# Patient Record
Sex: Female | Born: 2005 | Hispanic: No | Marital: Single | Smoking: Never smoker
Health system: Southern US, Community
[De-identification: ages and names within clinical notes are randomized; demographics above are authoritative.]

## PROBLEM LIST (undated history)

## (undated) ENCOUNTER — Ambulatory Visit: Payer: MEDICAID | Source: Home / Self Care

## (undated) DIAGNOSIS — F419 Anxiety disorder, unspecified: Secondary | ICD-10-CM

## (undated) DIAGNOSIS — J45909 Unspecified asthma, uncomplicated: Secondary | ICD-10-CM

## (undated) DIAGNOSIS — E785 Hyperlipidemia, unspecified: Secondary | ICD-10-CM

## (undated) DIAGNOSIS — F32A Depression, unspecified: Secondary | ICD-10-CM

## (undated) DIAGNOSIS — E039 Hypothyroidism, unspecified: Secondary | ICD-10-CM

## (undated) DIAGNOSIS — E079 Disorder of thyroid, unspecified: Secondary | ICD-10-CM

## (undated) DIAGNOSIS — E88819 Insulin resistance, unspecified: Secondary | ICD-10-CM

## (undated) DIAGNOSIS — E8881 Metabolic syndrome: Secondary | ICD-10-CM

## (undated) HISTORY — DX: Disorder of thyroid, unspecified: E07.9

## (undated) HISTORY — DX: Metabolic syndrome: E88.81

## (undated) HISTORY — DX: Insulin resistance, unspecified: E88.819

## (undated) HISTORY — DX: Anxiety disorder, unspecified: F41.9

## (undated) HISTORY — DX: Hypothyroidism, unspecified: E03.9

## (undated) HISTORY — DX: Hyperlipidemia, unspecified: E78.5

## (undated) HISTORY — DX: Depression, unspecified: F32.A

---
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
## 2023-01-07 ENCOUNTER — Ambulatory Visit
Admission: RE | Admit: 2023-01-07 | Discharge: 2023-01-07 | Disposition: A | Discharge status: Home / Self Care | Payer: Medicaid Other | Source: Ambulatory Visit | Attending: Internal Medicine | Admitting: Internal Medicine

## 2023-01-07 VITALS — BP 111/77 | HR 94 | Temp 97.9°F | Resp 18

## 2023-01-07 DIAGNOSIS — Z113 Encounter for screening for infections with a predominantly sexual mode of transmission: Secondary | ICD-10-CM | POA: Insufficient documentation

## 2023-01-07 DIAGNOSIS — R350 Frequency of micturition: Secondary | ICD-10-CM | POA: Insufficient documentation

## 2023-01-07 DIAGNOSIS — N926 Irregular menstruation, unspecified: Secondary | ICD-10-CM | POA: Insufficient documentation

## 2023-01-07 DIAGNOSIS — Z3202 Encounter for pregnancy test, result negative: Secondary | ICD-10-CM | POA: Insufficient documentation

## 2023-01-07 LAB — POCT URINALYSIS DIP (MANUAL ENTRY)
Bilirubin, UA: NEGATIVE
Blood, UA: NEGATIVE
Glucose, UA: NEGATIVE mg/dL
Ketones, POC UA: NEGATIVE mg/dL
Leukocytes, UA: NEGATIVE
Nitrite, UA: NEGATIVE
Protein Ur, POC: NEGATIVE mg/dL
Spec Grav, UA: 1.015 (ref 1.010–1.025)
Urobilinogen, UA: 0.2 E.U./dL
pH, UA: 6.5 (ref 5.0–8.0)

## 2023-01-07 LAB — POCT URINE PREGNANCY: Preg Test, Ur: NEGATIVE

## 2023-01-07 NOTE — Discharge Instructions (Signed)
Urine pregnancy test was negative.  Urinalysis to check for UTI was also negative.  Your vaginal swab is pending.  We will call if it is abnormal and treat as appropriate.  We ask that you refrain from sexual activity until test results and treatment are complete.  Follow-up with gynecology at provided contact information.  Go to the ER if any symptoms significantly persist or worsen.

## 2023-01-07 NOTE — ED Provider Notes (Addendum)
[name]    CSN: [id number] Arrival date & time: 01/07/23  1519      History   Chief Complaint Chief Complaint  Patient presents with   Possible Pregnancy    Entered by patient    HPI Reynelda Arredondo is a 17 y.o. female.   Patient presents with concern for pregnancy today.  Reports that she is not sure exactly when her last menstrual cycle was but thinks it was approximately 2 months ago.  Reports that she typically has regular menstrual cycles.  She does not currently use any form of birth control.  Reports that she had Nexplanon in the past but had it removed "a while back".  Reports that she has been having urinary frequency and right lower back pain for about a week.  Denies urinary burning, vaginal discharge, abnormal vaginal bleeding, pelvic pain, abdominal pain, nausea, vomiting, chills, fever.  Reports that she has had unprotected sexual intercourse but denies exposure to STD.  Denies any injury to the back. Reports that she drinks soda mainly.   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 No pertinent surgical history.  OB History   No obstetric history on file.      Home Medications    Prior to Admission medications   Medication Sig Start Date End Date Taking? Authorizing Provider  cephALEXin (KEFLEX) 500 MG capsule Take 1 capsule (500 mg total) by mouth 4 (four) times  daily. Patient not taking: Reported on 06/10/2022 05/25/22   Mesner, Barbara Cower, MD  cetirizine (ZYRTEC) 10 MG tablet Take 10 mg by mouth daily. Patient not taking: Reported on 06/10/2022 01/01/18   [provider]  FLUoxetine (PROZAC) 20 MG capsule TAKE 1 CAPSULE BY MOUTH EVERY DAY Patient not taking: Reported on 06/10/2022 02/08/21   Georges Mouse, NP  levothyroxine (SYNTHROID) 75 MCG tablet Take 1 tablet (75 mcg total) by mouth daily. 12/25/20 12/25/21  Gretchen Short, NP  montelukast (SINGULAIR) 10 MG tablet Take 10 mg by mouth daily. Patient not taking: Reported on 06/10/2022 05/24/20   [provider]  Multiple Vitamin (MULTIVITAMIN) tablet Take 1 tablet by mouth daily. Patient not taking: Reported on 06/10/2022    [provider]  omeprazole (PRILOSEC) 20 MG capsule Take 1 capsule (20 mg total) by mouth daily. Patient not taking: Reported on 06/10/2022 01/27/20   Little, Ambrose Finland, MD  ondansetron (ZOFRAN ODT) 4 MG disintegrating tablet Take 1 tablet (4 mg total) by mouth every 8 (eight) hours as needed for nausea or vomiting. Patient not taking: Reported on 04/17/2020 01/27/20   Little, Ambrose Finland, MD  oxyCODONE-acetaminophen (PERCOCET) 5-325 MG tablet Take 1 tablet by mouth every 8 (eight) hours as needed for severe pain. Patient not taking: Reported on 06/10/2022 05/25/22   Mesner, Barbara Cower, MD  predniSONE (DELTASONE) 20 MG tablet Take 2 tablets (40 mg total)  by mouth daily. 12/31/22   Valinda Hoar, NP  Prenatal Vit-Fe Fumarate-FA (PRENATAL VITAMINS) 28-0.8 MG TABS Take one tablet daily by mouth. 09/26/22   Georges Mouse, NP  PROAIR HFA 108 [phone] Base) MCG/ACT inhaler Inhale 2 puffs into the lungs every 4 (four) hours as needed for wheezing. Patient not taking: Reported on 06/10/2022 05/26/21   Lowanda Foster, NP    Family History Family History  Problem Relation Age of Onset   Hyperlipidemia Paternal Grandmother    Thyroid disease Paternal Grandmother      Social History Social History   Tobacco Use   Smoking status: Never    Passive exposure: Yes   Smokeless tobacco: Never  Vaping Use   Vaping Use: Some days   Substances: CBD  Substance Use Topics   Alcohol use: Yes    Comment: socially   Drug use: Yes    Types: Marijuana    Comment: daily     Allergies   Patient has no known allergies.   Review of Systems Review of Systems Per HPI  Physical Exam Triage Vital Signs ED Triage Vitals [01/07/23 1528]  Enc Vitals Group     BP 111/77     Pulse Rate 94     Resp 18     Temp 97.9 F (36.6 C)     Temp Source Oral     SpO2 95 %     Weight      Height      Head Circumference      Peak Flow      Pain Score      Pain Loc      Pain Edu?      Excl. in GC?    No data found.  Updated Vital Signs BP 111/77 (BP Location: Left Arm)   Pulse 94   Temp 97.9 F (36.6 C) (Oral)   Resp 18   LMP  (LMP Unknown)   SpO2 95%   Visual Acuity Right Eye Distance:   Left Eye Distance:   Bilateral Distance:    Right Eye Near:   Left Eye Near:    Bilateral Near:     Physical Exam Constitutional:      General: She is not in acute distress.    Appearance: Normal appearance. She is not toxic-appearing or diaphoretic.  HENT:     Head: Normocephalic and atraumatic.  Eyes:     Extraocular Movements: Extraocular movements intact.     Conjunctiva/sclera: Conjunctivae normal.  Pulmonary:     Effort: Pulmonary effort is normal.  Genitourinary:    Comments: Deferred with shared decision making. Self swab performed. Musculoskeletal:     Comments: Has very mild tenderness to palpation to right flank/right lower back.  No direct spinal tenderness, crepitus, step-off noted.  Neurological:     General: No focal deficit present.     Mental Status: She is alert and oriented to person, place, and time. Mental status is at baseline.  Psychiatric:        Mood and Affect: Mood normal.        Behavior: Behavior normal.        Thought  Content: Thought content normal.        Judgment: Judgment normal.      UC Treatments / Results  Labs (all labs ordered are listed, but only abnormal results are displayed) Labs Reviewed  POCT URINALYSIS DIP (MANUAL ENTRY) - Normal  POCT URINE PREGNANCY  CERVICOVAGINAL ANCILLARY ONLY  EKG   Radiology No results found.  Procedures Procedures (including critical care time)  Medications Ordered in UC Medications - No data to display  Initial Impression / Assessment and Plan / UC Course  I have reviewed the triage vital signs and the nursing notes.  Pertinent labs & imaging results that were available during my care of the patient were reviewed by me and considered in my medical decision making (see chart for details).     Urine pregnancy test was negative.  UA was completely unremarkable.  Offered patient hCG pregnancy testing by blood but she adamantly declined this.  Vaginal swab pending to rule out vaginitis as cause of missed menstrual cycle and urinary symptoms.  Awaiting results.  Low suspicion for pyelonephritis or kidney stone on physical exam.  Suspect possible musculoskeletal etiology of back pain.  Although, patient was encouraged to go to the emergency department if any of the symptoms persist or worsen.  Encouraged follow-up with gynecology at provided contact information given missed menstrual cycle as well.  Patient verbalized understanding and was agreeable with plan.  After further review of the chart, it appears the patient was seen approximately 7 days ago and had some right-sided flank pain as well.  Given this, attempted to contact patient after discharge to discuss that possible CT imaging at the emergency department may be warranted given persistent symptoms and new onset urinary symptoms.  Patient did not answer.  Left voicemail per privacy.  Despite the symptoms being persistent, patient is not having any fever and vital signs are stable so have a low  concern for pyelonephritis or kidney stone or an absolute need for emergent evaluation but it would be reasonable.. Do not have imaging capabilities here in urgent care.  Final Clinical Impressions(s) / UC Diagnoses   Final diagnoses:  Urine pregnancy test negative  Missed menses  Urinary frequency  Screening examination for venereal disease     Discharge Instructions      Urine pregnancy test was negative.  Urinalysis to check for UTI was also negative.  Your vaginal swab is pending.  We will call if it is abnormal and treat as appropriate.  We ask that you refrain from sexual activity until test results and treatment are complete.  Follow-up with gynecology at provided contact information.  Go to the ER if any symptoms significantly persist or worsen.    ED Prescriptions   None    PDMP not reviewed this encounter.   Gustavus Bryant, [place] 01/07/23 1615    Gustavus Bryant, [place] 01/07/23 1616

## 2023-01-07 NOTE — ED Triage Notes (Signed)
Pt present urinary frequency with back pain and she missed her period for two months. Symptoms started a week ago.

## 2023-01-08 ENCOUNTER — Telehealth: Payer: Self-pay | Admitting: Internal Medicine

## 2023-01-08 NOTE — Telephone Encounter (Signed)
Called patient to check on her and see if symptoms are still persistent.  She reports that she took Tylenol and she feels better.  Although, discussed with patient given flank pain has been persistent for over a week and given she has developed new urinary symptoms that she may need CT imaging and further evaluation at the emergency department.  She was agreeable with this plan and states that she will go today.  All questions answered.

## 2023-01-09 LAB — CERVICOVAGINAL ANCILLARY ONLY
Bacterial Vaginitis (gardnerella): NEGATIVE
Candida Glabrata: POSITIVE — AB
Candida Vaginitis: POSITIVE — AB
Chlamydia: NEGATIVE
Comment: NEGATIVE
Comment: NEGATIVE
Comment: NEGATIVE
Comment: NEGATIVE
Comment: NEGATIVE
Comment: NORMAL
Neisseria Gonorrhea: NEGATIVE
Trichomonas: NEGATIVE

## 2023-01-13 ENCOUNTER — Telehealth (HOSPITAL_COMMUNITY): Payer: Self-pay | Admitting: Emergency Medicine

## 2023-01-13 MED ORDER — FLUCONAZOLE 150 MG PO TABS: 150.0000 mg | ORAL_TABLET | Freq: Once | ORAL | 0 refills | Status: AC

## 2023-04-08 ENCOUNTER — Other Ambulatory Visit: Payer: Self-pay

## 2023-04-08 ENCOUNTER — Emergency Department (HOSPITAL_COMMUNITY): Payer: MEDICAID

## 2023-04-08 ENCOUNTER — Encounter (HOSPITAL_COMMUNITY): Payer: Self-pay

## 2023-04-08 ENCOUNTER — Emergency Department (HOSPITAL_COMMUNITY)
Admission: EM | Admit: 2023-04-08 | Discharge: 2023-04-08 | Disposition: A | Discharge status: Home / Self Care | Payer: MEDICAID | Attending: Emergency Medicine | Admitting: Emergency Medicine

## 2023-04-08 DIAGNOSIS — L0390 Cellulitis, unspecified: Secondary | ICD-10-CM | POA: Insufficient documentation

## 2023-04-08 DIAGNOSIS — R1030 Lower abdominal pain, unspecified: Secondary | ICD-10-CM | POA: Diagnosis not present

## 2023-04-08 DIAGNOSIS — Z7722 Contact with and (suspected) exposure to environmental tobacco smoke (acute) (chronic): Secondary | ICD-10-CM | POA: Diagnosis not present

## 2023-04-08 DIAGNOSIS — R0602 Shortness of breath: Secondary | ICD-10-CM | POA: Diagnosis not present

## 2023-04-08 DIAGNOSIS — J45909 Unspecified asthma, uncomplicated: Secondary | ICD-10-CM | POA: Diagnosis not present

## 2023-04-08 DIAGNOSIS — E039 Hypothyroidism, unspecified: Secondary | ICD-10-CM | POA: Insufficient documentation

## 2023-04-08 DIAGNOSIS — R059 Cough, unspecified: Secondary | ICD-10-CM | POA: Diagnosis not present

## 2023-04-08 LAB — URINALYSIS, ROUTINE W REFLEX MICROSCOPIC
Bilirubin Urine: NEGATIVE
Glucose, UA: NEGATIVE mg/dL
Hgb urine dipstick: NEGATIVE
Ketones, ur: NEGATIVE mg/dL
Leukocytes,Ua: NEGATIVE
Nitrite: NEGATIVE
Protein, ur: NEGATIVE mg/dL
Specific Gravity, Urine: 1.019 (ref 1.005–1.030)
pH: 6 (ref 5.0–8.0)

## 2023-04-08 LAB — PREGNANCY, URINE: Preg Test, Ur: NEGATIVE

## 2023-04-08 MED ORDER — DOXYCYCLINE HYCLATE 100 MG PO CAPS: 100.0000 mg | ORAL_CAPSULE | Freq: Two times a day (BID) | ORAL | 0 refills | Status: AC

## 2023-04-08 NOTE — ED Notes (Signed)
Transported to XR  

## 2023-04-08 NOTE — ED Notes (Signed)
Pt ambulatory to bathroom without difficulty.  

## 2023-04-08 NOTE — ED Provider Notes (Signed)
[facility] Emergency Department Provider Note MRN:  [id number]  Arrival date & time: 04/08/23     Chief Complaint   Shortness of Breath   History of Present Illness   Emily Allen is a 17 y.o. year-old female with a history of anxiety, depression presenting to the ED with chief complaint of shortness of breath.  2 months of persistent productive cough, feels short of breath because of all of the mucus.  Denies leg pain or swelling, no history of blood clots, no birth control pills.  Also has a boil on her leg but she popped but now it is red.  Also having some lower abdominal cramping on occasion.  Review of Systems  A thorough review of systems was obtained and all systems are negative except as noted in the HPI and PMH.   Patient's Health History    Past Medical History:  Diagnosis Date   Anxiety    Phreesia 04/27/2020   Asthma    Depression    Phreesia 04/27/2020   Hyperlipidemia    Phreesia 04/27/2020   Hypothyroidism    Insulin resistance    Thyroid disease    Phreesia 04/27/2020    History reviewed. No pertinent surgical history.  Family History  Problem Relation Age of Onset   Hyperlipidemia Paternal Grandmother    Thyroid disease Paternal Grandmother     Social History   Socioeconomic History   Marital status: Single    Spouse name: Not on file   Number of children: Not on file   Years of education: Not on file   Highest education level: Not on file  Occupational History   Not on file  Tobacco Use   Smoking status: Never    Passive exposure: Yes   Smokeless tobacco: Never  Vaping Use   Vaping status: Some Days   Substances: CBD  Substance and Sexual Activity   Alcohol use: Yes    Comment: socially   Drug use: Yes    Types: Marijuana    Comment: daily   Sexual activity: Not on file  Other Topics Concern   Not on file  Social History Narrative   [name] 9th grade. In class and virtual.      Lives with [name] and  brother.    Social Determinants of Health   Financial Resource Strain: Not on file  Food Insecurity: Not on file  Transportation Needs: Not on file  Physical Activity: Not on file  Stress: Not on file  Social Connections: Unknown (12/22/2022)   Received from [facility], [facility]   Social Network    Social Network: Not on file  Intimate Partner Violence: Unknown (12/22/2022)   Received from [facility], [facility]   HITS    Physically Hurt: Not on file    Insult or Talk Down To: Not on file    Threaten Physical Harm: Not on file    Scream or Curse: Not on file     Physical Exam   Vitals:   04/08/23 0225 04/08/23 0305  BP: 106/76 (!) 104/54  Pulse: 97 74  Resp: 20 15  Temp: 98.1 F (36.7 C)   SpO2: 99% 100%    CONSTITUTIONAL: Well-appearing, NAD NEURO/PSYCH:  Alert and oriented x 3, no focal deficits EYES:  eyes equal and reactive ENT/NECK:  no LAD, no JVD CARDIO: Regular rate, well-perfused, normal S1 and S2 PULM:  CTAB no wheezing or rhonchi GI/GU:  non-distended, non-tender MSK/SPINE:  No gross deformities, no edema  SKIN:  no rash, atraumatic   *Additional and/or pertinent findings included in MDM below  Diagnostic and Interventional Summary    EKG Interpretation Date/Time:  Wednesday April 08 2023 02:04:07 EDT Ventricular Rate:  92 PR Interval:  142 QRS Duration:  80 QT Interval:  342 QTC Calculation: 422 R Axis:   92  Text Interpretation: Normal sinus rhythm Rightward axis Borderline ECG When compared with ECG of 17-Nov-2022 08:04, PREVIOUS ECG IS PRESENT Confirmed by Kennis Carina [phone]) on 04/08/2023 3:25:13 AM       Labs Reviewed  URINALYSIS, ROUTINE W REFLEX MICROSCOPIC - Abnormal; Notable for the following components:      Result Value   APPearance HAZY (*)    All other components within normal limits  PREGNANCY, URINE    DG Abdomen Acute W/Chest  Final Result      Medications - No data to display   Procedures  /  Critical  Care Procedures  ED Course and Medical Decision Making  Initial Impression and Ddx Patient with multiple complaints this evening.  Looks like she has a mild cellulitis to the legs stemming from a folliculitis that she popped on her own.  I do not palpate any further fluctuance, no need for I&D here in the emergency department.  Will provide a course of doxycycline.  She is having a chronic cough productive of sputum for the past 1 or 2 months.  She is having some vague abdominal cramps but not currently having any abdominal pain.  She is overall sitting comfortably and well-appearing and with normal vital signs and her abdomen is soft and her lungs are clear and she has no increased work of breathing.  Doubt emergent process.  Does not really seem short of breath.  PERC negative.  Past medical/surgical history that increases complexity of ED encounter: None  Interpretation of Diagnostics I personally reviewed the EKG and my interpretation is as follows: Sinus rhythm  X-ray normal  Patient Reassessment and Ultimate Disposition/Management     Discharge  Patient management required discussion with the following services or consulting groups:  None  Complexity of Problems Addressed Acute complicated illness or Injury  Additional Data Reviewed and Analyzed Further history obtained from: Further history from spouse/family member  Additional Factors Impacting ED Encounter Risk Prescriptions  Elmer Sow. Pilar Plate, MD [facility] Emergency Medicine [facility] [email or web address]  Final Clinical Impressions(s) / ED Diagnoses     ICD-10-CM   1. Cough, unspecified type  R05.9     2. Cellulitis, unspecified cellulitis site  L03.90       ED Discharge Orders          Ordered    doxycycline (VIBRAMYCIN) 100 MG capsule  2 times daily        04/08/23 0510             Discharge Instructions Discussed with and Provided to Patient:     Discharge Instructions       You were evaluated in the Emergency Department and after careful evaluation, we did not find any emergent condition requiring admission or further testing in the hospital.  Your exam/testing today is overall reassuring.  X-ray and EKG were normal.  Take the doxycycline antibiotic as directed.  Please return to the Emergency Department if you experience any worsening of your condition.   Thank you for allowing [place] to be a part of your care.       Sabas Sous, MD 04/08/23 [phone]

## 2023-04-08 NOTE — ED Triage Notes (Signed)
Pt reports shortness of breath associated with productive of cough x 2 days. She is also here with c/o back pain, abdominal pain and abscess to her right inner thigh.

## 2023-04-08 NOTE — Discharge Instructions (Signed)
You were evaluated in the Emergency Department and after careful evaluation, we did not find any emergent condition requiring admission or further testing in the hospital.  Your exam/testing today is overall reassuring.  X-ray and EKG were normal.  Take the doxycycline antibiotic as directed.  Please return to the Emergency Department if you experience any worsening of your condition.   Thank you for allowing [place] to be a part of your care.

## 2023-05-24 NOTE — Progress Notes (Deleted)
New Patient Note  RE: Emily Allen MRN: [id number] DOB: Jun 15, 2006 Date of Office Visit: 05/25/2023  Consult requested by: [name], MD Primary care provider: [name], MD  Chief Complaint: No chief complaint on file.  History of Present Illness: I had the pleasure of seeing Emily Allen for initial evaluation at the [facility] on 05/24/2023. She is a 17 y.o. female, who is referred here by [name], MD for the evaluation of asthma.  Discussed the use of AI scribe software for clinical note transcription with the patient, who gave verbal consent to proceed.  History of Present Illness             She reports symptoms of *** chest tightness, shortness of breath, coughing, wheezing, nocturnal awakenings for *** years. Current medications include *** which help. She reports *** using aerochamber with inhalers. She tried the following inhalers: ***. Main triggers are ***allergies, infections, weather changes, smoke, exercise, pet exposure. In the last month, frequency of symptoms: ***x/week. Frequency of nocturnal symptoms: ***x/month. Frequency of SABA use: ***x/week. Interference with physical activity: ***. Sleep is ***disturbed. In the last 12 months, emergency room visits/urgent care visits/doctor office visits or hospitalizations due to respiratory issues: ***. In the last 12 months, oral steroids courses: ***. Lifetime history of hospitalization for respiratory issues: ***. Prior intubations: ***. Asthma was diagnosed at age *** by ***. History of pneumonia: ***. She was evaluated by allergist ***pulmonologist in the past. Smoking exposure: ***. Up to date with flu vaccine: ***. Up to date with pneumonia vaccine: ***. Up to date with COVID-19 vaccine: ***. Prior Covid-19 infection: ***. History of reflux: ***.  Assessment and Plan: Emily Allen is a 17 y.o. female with: ***  Assessment and Plan               No follow-ups on  file.  No orders of the defined types were placed in this encounter.  Lab Orders  No laboratory test(s) ordered today    Other allergy screening: Asthma: {Blank single:19197::"yes","no"} Rhino conjunctivitis: {Blank single:19197::"yes","no"} Food allergy: {Blank single:19197::"yes","no"} Medication allergy: {Blank single:19197::"yes","no"} Hymenoptera allergy: {Blank single:19197::"yes","no"} Urticaria: {Blank single:19197::"yes","no"} Eczema:{Blank single:19197::"yes","no"} History of recurrent infections suggestive of immunodeficency: {Blank single:19197::"yes","no"}  Diagnostics: Spirometry:  Tracings reviewed. Her effort: {Blank single:19197::"Good reproducible efforts.","It was hard to get consistent efforts and there is a question as to whether this reflects a maximal maneuver.","Poor effort, data can not be interpreted."} FVC: ***L FEV1: ***L, ***% predicted FEV1/FVC ratio: ***% Interpretation: {Blank single:19197::"Spirometry consistent with mild obstructive disease","Spirometry consistent with moderate obstructive disease","Spirometry consistent with severe obstructive disease","Spirometry consistent with possible restrictive disease","Spirometry consistent with mixed obstructive and restrictive disease","Spirometry uninterpretable due to technique","Spirometry consistent with normal pattern","No overt abnormalities noted given today's efforts"}.  Please see scanned spirometry results for details.  Skin Testing: {Blank single:19197::"Select foods","Environmental allergy panel","Environmental allergy panel and select foods","Food allergy panel","None","Deferred due to recent antihistamines use"}. *** Results discussed with patient/family.   Past Medical History: Patient Active Problem List   Diagnosis Date Noted  . Nexplanon in place 11/04/2018  . Menorrhagia 08/10/2018  . Acanthosis nigricans 12/01/2017  . Insulin resistance 12/01/2017  . 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 Anxiety    Phreesia 04/27/2020  . Asthma   . Depression    Phreesia 04/27/2020  . Hyperlipidemia    Phreesia 04/27/2020  .  Hypothyroidism   . Insulin resistance   . Thyroid disease    Phreesia 04/27/2020   Past Surgical History: No past surgical history on file. Medication List:  Current Outpatient Medications  Medication Sig Dispense Refill  . cephALEXin (KEFLEX) 500 MG capsule Take 1 capsule (500 mg total) by mouth 4 (four) times daily. (Patient not taking: Reported on 06/10/2022) 28 capsule 0  . cetirizine (ZYRTEC) 10 MG tablet Take 10 mg by mouth daily. (Patient not taking: Reported on 06/10/2022)  3  . FLUoxetine (PROZAC) 20 MG capsule TAKE 1 CAPSULE BY MOUTH EVERY DAY (Patient not taking: Reported on 06/10/2022) 90 capsule 0  . levothyroxine (SYNTHROID) 75 MCG tablet Take 1 tablet (75 mcg total) by mouth daily. 30 tablet 3  . montelukast (SINGULAIR) 10 MG tablet Take 10 mg by mouth daily. (Patient not taking: Reported on 06/10/2022)    . Multiple Vitamin (MULTIVITAMIN) tablet Take 1 tablet by mouth daily. (Patient not taking: Reported on 06/10/2022)    . omeprazole (PRILOSEC) 20 MG capsule Take 1 capsule (20 mg total) by mouth daily. (Patient not taking: Reported on 06/10/2022) 14 capsule 0  . ondansetron (ZOFRAN ODT) 4 MG disintegrating tablet Take 1 tablet (4 mg total) by mouth every 8 (eight) hours as needed for nausea or vomiting. (Patient not taking: Reported on 04/17/2020) 5 tablet 0  . oxyCODONE-acetaminophen (PERCOCET) 5-325 MG tablet Take 1 tablet by mouth every 8 (eight) hours as needed for severe pain. (Patient not taking: Reported on 06/10/2022) 15 tablet 0  . predniSONE (DELTASONE) 20 MG tablet Take 2 tablets (40 mg total) by mouth daily. 10 tablet 0  . Prenatal Vit-Fe Fumarate-FA (PRENATAL VITAMINS)  28-0.8 MG TABS Take one tablet daily by mouth. 90 tablet 2  . PROAIR HFA 108 (90 Base) MCG/ACT inhaler Inhale 2 puffs into the lungs every 4 (four) hours as needed for wheezing. (Patient not taking: Reported on 06/10/2022) 18 g 1   No current facility-administered medications for this visit.   Allergies: No Known Allergies Social History: Social History   Socioeconomic History  . Marital status: Single    Spouse name: Not on file  . Number of children: Not on file  . Years of education: Not on file  . Highest education level: Not on file  Occupational History  . Not on file  Tobacco Use  . Smoking status: Never    Passive exposure: Yes  . Smokeless tobacco: Never  Vaping Use  . Vaping status: Some Days  . Substances: CBD  Substance and Sexual Activity  . Alcohol use: Yes    Comment: socially  . Drug use: Yes    Types: Marijuana    Comment: daily  . Sexual activity: Not on file  Other Topics Concern  . Not on file  Social History Narrative   [name] 9th grade. In class and virtual.      Lives with [name] and brother.    Social Determinants of Health   Financial Resource Strain: Not on file  Food Insecurity: Not on file  Transportation Needs: Not on file  Physical Activity: Not on file  Stress: Not on file  Social Connections: Unknown (12/22/2022)   Received [facility]   Social Network   . Social Network: Not on file   Lives in a ***. Smoking: *** Occupation: ***  Environmental History: Water Damage/mildew in the house: Copywriter, advertising in the family room: {Blank single:19197::"yes","no"} Carpet in the bedroom: {Blank single:19197::"yes","no"} Heating: {Blank single:19197::"electric","gas","heat  pump"} Cooling: {Blank single:19197::"central","window","heat pump"} Pet: {Blank single:19197::"yes ***","no"}  Family History: Family History  Problem Relation Age of Onset  . Hyperlipidemia Paternal Grandmother   .  Thyroid disease Paternal Grandmother    Problem                               Relation Asthma                                   *** Eczema                                *** Food allergy                          *** Allergic rhino conjunctivitis     ***  Review of Systems  Constitutional:  Negative for appetite change, chills, fever and unexpected weight change.  HENT:  Negative for congestion and rhinorrhea.   Eyes:  Negative for itching.  Respiratory:  Negative for cough, chest tightness, shortness of breath and wheezing.   Cardiovascular:  Negative for chest pain.  Gastrointestinal:  Negative for abdominal pain.  Genitourinary:  Negative for difficulty urinating.  Skin:  Negative for rash.  Neurological:  Negative for headaches.   Objective: There were no vitals taken for this visit. There is no height or weight on file to calculate BMI. Physical Exam Vitals and nursing note reviewed.  Constitutional:      Appearance: Normal appearance. She is well-developed.  HENT:     Head: Normocephalic and atraumatic.     Right Ear: Tympanic membrane and external ear normal.     Left Ear: Tympanic membrane and external ear normal.     Nose: Nose normal.     Mouth/Throat:     Mouth: Mucous membranes are moist.     Pharynx: Oropharynx is clear.  Eyes:     Conjunctiva/sclera: Conjunctivae normal.  Cardiovascular:     Rate and Rhythm: Normal rate and regular rhythm.     Heart sounds: Normal heart sounds. No murmur heard.    No friction rub. No gallop.  Pulmonary:     Effort: Pulmonary effort is normal.     Breath sounds: Normal breath sounds. No wheezing, rhonchi or rales.  Musculoskeletal:     Cervical back: Neck supple.  Skin:    General: Skin is warm.     Findings: No rash.  Neurological:     Mental Status: She is alert and oriented to person, place, and time.  Psychiatric:        Behavior: Behavior normal.  The plan was reviewed with the patient/family, and all  questions/concerned were addressed.  It was my pleasure to see Emily Allen today and participate in her care. Please feel free to contact me with any questions or concerns.  Sincerely,  Wyline Mood, DO Allergy & Immunology  [facility] office: [phone] [facility] office: [phone]

## 2023-05-25 ENCOUNTER — Ambulatory Visit: Payer: Self-pay | Admitting: Allergy

## 2023-09-15 ENCOUNTER — Emergency Department (HOSPITAL_COMMUNITY)
Admission: EM | Admit: 2023-09-15 | Discharge: 2023-09-15 | Discharge status: Against Medical Advice | Payer: MEDICAID | Attending: Emergency Medicine | Admitting: Emergency Medicine

## 2023-09-15 ENCOUNTER — Emergency Department (HOSPITAL_COMMUNITY): Payer: MEDICAID

## 2023-09-15 ENCOUNTER — Encounter (HOSPITAL_COMMUNITY): Payer: Self-pay

## 2023-09-15 ENCOUNTER — Other Ambulatory Visit: Payer: Self-pay

## 2023-09-15 DIAGNOSIS — R1013 Epigastric pain: Secondary | ICD-10-CM | POA: Insufficient documentation

## 2023-09-15 DIAGNOSIS — R0602 Shortness of breath: Secondary | ICD-10-CM | POA: Diagnosis not present

## 2023-09-15 DIAGNOSIS — R059 Cough, unspecified: Secondary | ICD-10-CM | POA: Insufficient documentation

## 2023-09-15 DIAGNOSIS — R0981 Nasal congestion: Secondary | ICD-10-CM | POA: Diagnosis not present

## 2023-09-15 DIAGNOSIS — Z5321 Procedure and treatment not carried out due to patient leaving prior to being seen by health care provider: Secondary | ICD-10-CM | POA: Diagnosis not present

## 2023-09-15 DIAGNOSIS — R197 Diarrhea, unspecified: Secondary | ICD-10-CM | POA: Diagnosis not present

## 2023-09-15 LAB — CBC WITH DIFFERENTIAL/PLATELET
Abs Immature Granulocytes: 0.02 10*3/uL (ref 0.00–0.07)
Basophils Absolute: 0 10*3/uL (ref 0.0–0.1)
Basophils Relative: 0 %
Eosinophils Absolute: 0.2 10*3/uL (ref 0.0–0.5)
Eosinophils Relative: 2 %
HCT: 41 % (ref 36.0–46.0)
Hemoglobin: 13.1 g/dL (ref 12.0–15.0)
Immature Granulocytes: 0 %
Lymphocytes Relative: 31 %
Lymphs Abs: 2.3 10*3/uL (ref 0.7–4.0)
MCH: 27.1 pg (ref 26.0–34.0)
MCHC: 32 g/dL (ref 30.0–36.0)
MCV: 84.7 fL (ref 80.0–100.0)
Monocytes Absolute: 0.3 10*3/uL (ref 0.1–1.0)
Monocytes Relative: 4 %
Neutro Abs: 4.6 10*3/uL (ref 1.7–7.7)
Neutrophils Relative %: 63 %
Platelets: 307 10*3/uL (ref 150–400)
RBC: 4.84 MIL/uL (ref 3.87–5.11)
RDW: 12.3 % (ref 11.5–15.5)
WBC: 7.4 10*3/uL (ref 4.0–10.5)
nRBC: 0 % (ref 0.0–0.2)

## 2023-09-15 LAB — URINALYSIS, ROUTINE W REFLEX MICROSCOPIC
Bacteria, UA: NONE SEEN
Bilirubin Urine: NEGATIVE
Glucose, UA: NEGATIVE mg/dL
Ketones, ur: NEGATIVE mg/dL
Leukocytes,Ua: NEGATIVE
Nitrite: NEGATIVE
Protein, ur: NEGATIVE mg/dL
RBC / HPF: >50 RBC/hpf (ref 0–5)
Specific Gravity, Urine: 1.029 (ref 1.005–1.030)
pH: 5 (ref 5.0–8.0)

## 2023-09-15 LAB — COMPREHENSIVE METABOLIC PANEL
ALT: 20 U/L (ref 0–44)
AST: 16 U/L (ref 15–41)
Albumin: 3.8 g/dL (ref 3.5–5.0)
Alkaline Phosphatase: 60 U/L (ref 38–126)
Anion gap: 7 (ref 5–15)
BUN: 9 mg/dL (ref 6–20)
CO2: 22 mmol/L (ref 22–32)
Calcium: 8.8 mg/dL — ABNORMAL LOW (ref 8.9–10.3)
Chloride: 109 mmol/L (ref 98–111)
Creatinine, Ser: 0.46 mg/dL (ref 0.44–1.00)
GFR, Estimated: >60 mL/min (ref >60)
Glucose, Bld: 119 mg/dL — ABNORMAL HIGH (ref 70–99)
Potassium: 3.9 mmol/L (ref 3.5–5.1)
Sodium: 138 mmol/L (ref 135–145)
Total Bilirubin: 0.3 mg/dL (ref 0.0–1.2)
Total Protein: 7.2 g/dL (ref 6.5–8.1)

## 2023-09-15 LAB — TROPONIN I (HIGH SENSITIVITY)
Troponin I (High Sensitivity): <2 ng/L (ref <18)
Troponin I (High Sensitivity): <2 ng/L (ref <18)

## 2023-09-15 LAB — HCG, SERUM, QUALITATIVE: Preg, Serum: NEGATIVE

## 2023-09-15 NOTE — ED Notes (Signed)
 Pt returned identification labels to front desk. States leaving prior to treatment.

## 2023-09-15 NOTE — ED Triage Notes (Signed)
 Pt reports with cough, diarrhea, and mid chest pain that goes down to her stomach since today.

## 2023-09-15 NOTE — ED Provider Triage Note (Signed)
 Emergency Medicine Provider Triage Evaluation Note  Emily Allen , a 18 y.o. female  was evaluated in triage.  Pt complains of productive cough for two months. No fevers. Reports nasal congestion. No rhinorrhea. Reports that occasionally will feel tight when she coughs. Reports that she has been having sharp pain intermittently since last night. Had some Sob last night that resolved. Diarrhea started this morning, reports some nausea, no vomiting. Denies any urinary or vaginal symptoms. Denies any sick contacts. Took Midol  this morning as she is on her cycle. She reports some epigastric pain that feels like her acid reflux. Reports she has been more flatulent and belching nmore.   Review of Systems  Positive:  Negative:   Physical Exam  BP 136/84   Pulse 69   Temp 98.3 F (36.8 C) (Oral)   Resp (!) 23   Ht 5' 3 (1.6 m)   Wt 106.6 kg   SpO2 99%   BMI 41.63 kg/m  Gen:   Awake, no distress   Resp:  Normal effort  MSK:   Moves extremities without difficulty  Other:  Abdomen soft and non tender. Diffuse anterior chest wall tenderness to palpation. CTAB. RRR.   Medical Decision Making  Medically screening exam initiated at 3:34 PM.  Appropriate orders placed.  Emily Allen was informed that the remainder of the evaluation will be completed by another provider, this initial triage assessment does not replace that evaluation, and the importance of remaining in the ED until their evaluation is complete.  Labs ordered.    Bernis Ernst, PA-C 09/15/23 1540

## 2023-09-16 ENCOUNTER — Ambulatory Visit
Admission: EM | Admit: 2023-09-16 | Discharge: 2023-09-16 | Disposition: A | Discharge status: Home / Self Care | Payer: MEDICAID

## 2023-09-16 ENCOUNTER — Ambulatory Visit (HOSPITAL_COMMUNITY): Payer: Self-pay

## 2023-09-16 VITALS — BP 130/82 | HR 96 | Temp 98.1°F | Resp 16

## 2023-09-16 DIAGNOSIS — J4541 Moderate persistent asthma with (acute) exacerbation: Secondary | ICD-10-CM

## 2023-09-16 DIAGNOSIS — R0789 Other chest pain: Secondary | ICD-10-CM

## 2023-09-16 DIAGNOSIS — R0602 Shortness of breath: Secondary | ICD-10-CM

## 2023-09-16 DIAGNOSIS — B349 Viral infection, unspecified: Secondary | ICD-10-CM

## 2023-09-16 LAB — POC COVID19/FLU A&B COMBO
Covid Antigen, POC: NEGATIVE
Influenza A Antigen, POC: NEGATIVE
Influenza B Antigen, POC: NEGATIVE

## 2023-09-16 MED ORDER — PREDNISONE 10 MG (21) PO TBPK: ORAL_TABLET | ORAL | 0 refills | Status: DC

## 2023-09-16 MED ORDER — AEROCHAMBER PLUS FLO-VU LARGE MISC
1.0000 | Freq: Once | Status: AC
  Administered 2023-09-16: 1

## 2023-09-16 MED ORDER — ALBUTEROL SULFATE HFA 108 (90 BASE) MCG/ACT IN AERS
2.0000 | INHALATION_SPRAY | Freq: Once | RESPIRATORY_TRACT | Status: AC
  Administered 2023-09-16: 2 via RESPIRATORY_TRACT

## 2023-09-16 NOTE — ED Triage Notes (Signed)
 Pt reports chest pain and tightness x2 days. Describes sharp pain that started in middle of chest and now runs down to middle of her stomach. Intermittent SOB reported. N/V have accompanied these symptoms x2 days. No med use at home for symptoms. Was seen at ED last night but walked out due to wait.

## 2023-09-16 NOTE — ED Provider Notes (Signed)
 [name]    CSN: [id number] Arrival date & time: 09/16/23  1731      History   Chief Complaint Chief Complaint  Patient presents with   Chest Injury    Tightening of the chest with sharp constant pain shooting up my chest and under my tittes. Diarrhea, vomiting, shortness of breath, and sometimes stomach pain - Entered by patient   Chest Pain    HPI Emily Allen is a 18 y.o. female.   Patient presents today with a 2 to 3-day history of URI symptoms including cough, congestion, chest pain, shortness of breath, chest tightness.  She is also developed nausea, vomiting, diarrhea with last episode earlier today.  She was seen in the emergency room yesterday and had negative CBC, CMP, troponins.  She does not have any viral testing.  She was not evaluated by provider outside of triage because she had to wait for 13+ hours and did not feel she could wait any longer.  She does have a history of asthma and states current symptoms are similar to previous episodes of this condition.  She has not been using her asthma medication more frequently.  Denies any recent antibiotics or steroids.  She is confident that she is not pregnant.  She has not had COVID in the past.  She has not had COVID-19 vaccinations.    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 No pertinent surgical history.  OB History   No obstetric history on  file.      Home Medications    Prior to Admission medications   Medication Sig Start Date End Date Taking? Authorizing Provider  albuterol  (PROVENTIL ) (2.5 MG/3ML) 0.083% nebulizer solution SMARTSIG:1 Nebule Via Nebulizer Every 4 Hours PRN 04/14/23  Yes [provider]  predniSONE  (STERAPRED UNI-PAK 21 TAB) 10 MG (21) TBPK tablet As directed 09/16/23  Yes Sophiah Rolin K, PA-C  SYMBICORT 80-4.5 MCG/ACT inhaler Inhale into the lungs. 06/19/23  Yes [provider]  FLUoxetine  (PROZAC ) 20 MG capsule TAKE 1 CAPSULE BY MOUTH EVERY DAY Patient not taking: Reported on 06/10/2022 02/08/21   Marijean Shouts, NP  levothyroxine  (SYNTHROID ) 75 MCG tablet Take 1 tablet (75 mcg total) by mouth daily. 12/25/20 12/25/21  Candee Cha, NP  montelukast  (SINGULAIR ) 10 MG tablet Take 10 mg by mouth daily. Patient not taking: Reported on 06/10/2022 05/24/20   [provider]  Multiple Vitamin (MULTIVITAMIN) tablet Take 1 tablet by mouth daily. Patient not taking: Reported on 06/10/2022    [provider]  Prenatal Vit-Fe Fumarate-FA (PRENATAL VITAMINS) 28-0.8 MG TABS Take one tablet daily by mouth. Patient not taking: Reported on 09/16/2023 09/26/22   Marijean Shouts, NP    Family History Family History  Problem Relation Age of Onset   Hyperlipidemia Paternal Grandmother    Thyroid  disease Paternal Grandmother     Social History Social History  Tobacco Use   Smoking status: Never    Passive exposure: Yes   Smokeless tobacco: Never  Vaping Use   Vaping status: Some Days   Substances: CBD  Substance Use Topics   Alcohol use: Yes    Comment: socially   Drug use: Yes    Types: Marijuana    Comment: daily     Allergies   Patient has no known allergies.   Review of Systems Review of Systems  Constitutional:  Positive for activity change. Negative for appetite change, fatigue and fever.  HENT:  Positive for congestion. Negative for sinus pressure,  sneezing and sore throat.   Respiratory:  Positive for cough, chest tightness and shortness of breath. Negative for wheezing.   Cardiovascular:  Negative for chest pain.  Gastrointestinal:  Positive for diarrhea, nausea and vomiting. Negative for abdominal pain.  Neurological:  Negative for dizziness, light-headedness and headaches.     Physical Exam Triage Vital Signs ED Triage Vitals  Encounter Vitals Group     BP 09/16/23 1738 130/82     Systolic BP Percentile --      Diastolic BP Percentile --      Pulse Rate 09/16/23 1738 96     Resp 09/16/23 1738 16     Temp 09/16/23 1738 98.1 F (36.7 C)     Temp Source 09/16/23 1738 Oral     SpO2 09/16/23 1738 96 %     Weight --      Height --      Head Circumference --      Peak Flow --      Pain Score 09/16/23 1740 7     Pain Loc --      Pain Education --      Exclude from Growth Chart --    No data found.  Updated Vital Signs BP 130/82 (BP Location: Left Arm)   Pulse 96   Temp 98.1 F (36.7 C) (Oral)   Resp 16   LMP 09/14/2023 (Exact Date)   SpO2 96%   Visual Acuity Right Eye Distance:   Left Eye Distance:   Bilateral Distance:    Right Eye Near:   Left Eye Near:    Bilateral Near:     Physical Exam Vitals reviewed.  Constitutional:      General: She is awake. She is not in acute distress.    Appearance: Normal appearance. She is well-developed. She is not ill-appearing.     Comments: Very pleasant female appears stated age in no acute distress sitting comfortably in exam room  HENT:     Head: Normocephalic and atraumatic.     Right Ear: Tympanic membrane, ear canal and external ear normal. Tympanic membrane is not erythematous or bulging.     Left Ear: Ear canal and external ear normal. A middle ear effusion is present. Tympanic membrane is not erythematous or bulging.     Nose:     Right Sinus: No maxillary sinus tenderness or frontal sinus tenderness.     Left Sinus: No maxillary sinus tenderness or frontal  sinus tenderness.     Mouth/Throat:     Pharynx: Uvula midline. Postnasal drip present. No oropharyngeal exudate or posterior oropharyngeal erythema.  Cardiovascular:     Rate and Rhythm: Normal rate and regular rhythm.     Heart sounds: Normal heart sounds, S1 normal and S2 normal. No murmur heard. Pulmonary:     Effort: Pulmonary effort is normal.     Breath sounds: Examination of  the right-lower field reveals decreased breath sounds. Examination of the left-lower field reveals decreased breath sounds. Decreased breath sounds present. No wheezing, rhonchi or rales.  Abdominal:     General: Bowel sounds are normal.     Palpations: Abdomen is soft.     Tenderness: There is no abdominal tenderness.     Comments: Benign abdominal exam  Psychiatric:        Behavior: Behavior is cooperative.      UC Treatments / Results  Labs (all labs ordered are listed, but only abnormal results are displayed) Labs Reviewed  POC COVID19/FLU A&B COMBO    EKG   Radiology DG Chest 2 View Result Date: 09/15/2023 CLINICAL DATA:  Cough.  Chest pain EXAM: CHEST - 2 VIEW COMPARISON:  X-ray 11/17/2022. FINDINGS: The heart size and mediastinal contours are within normal limits. No consolidation, pneumothorax or effusion. No edema. The visualized skeletal structures are unremarkable. Artifact from the patient's clothing. IMPRESSION: No acute cardiopulmonary disease. Electronically Signed   By: Adrianna Horde M.D.   On: 09/15/2023 16:48    Procedures Procedures (including critical care time)  Medications Ordered in UC Medications  albuterol  (VENTOLIN  HFA) 108 (90 Base) MCG/ACT inhaler 2 puff (2 puffs Inhalation Given 09/16/23 1836)  AeroChamber Plus Flo-Vu Large MISC 1 each (1 each Other Given 09/16/23 1836)    Initial Impression / Assessment and Plan / UC Course  I have reviewed the triage vital signs and the nursing notes.  Pertinent labs & imaging results that were available during my care of the  patient were reviewed by me and considered in my medical decision making (see chart for details).     Patient is well-appearing, afebrile, nontoxic, nontachycardic.  EKG was obtained by nursing staff in triage given associated chest tightness/chest pain that showed normal sinus rhythm with ventricular rate of 68 bpm without ischemic changes and without significant change compared to previous tracing.  Chest x-ray was deferred as she had a normal x-ray yesterday at the emergency room.  She did have some wheezing and we discussed that asthma is likely contributing to her symptoms.  She was given albuterol  in clinic with some improvement.  I recommend she continue using this every 4-6 hours as needed.  Viral testing was obtained and was negative for COVID and flu.  We discussed that viral illnesses likely triggered her asthma.  She was started on prednisone  taper with instructions to take NSAIDs with this medication.  She can use over-the-counter medications as Mucinex, Flonase , Tylenol  for additional symptom relief.  Discussed that if her symptoms are improving within a few days she is to return for reevaluation.  Strict return precautions given.  Work excuse note provided.  Final Clinical Impressions(s) / UC Diagnoses   Final diagnoses:  Viral illness  Moderate persistent asthma with acute exacerbation  Chest tightness  Shortness of breath     Discharge Instructions      You were negative for flu and COVID here.  I believe that you have a viral illness that is triggering your asthma.  Use the albuterol  every 4-6 hours as needed.  Start prednisone  taper.  Do not take NSAIDs with this medication including aspirin, ibuprofen /Advil , naproxen/Aleve.  You can use Mucinex, Tylenol , nasal saline/sinus rinses for additional symptom relief.  If you are not feeling better within a few days or if anything worsens and you have worsening cough, shortness of breath, chest pain you should be seen  immediately.     ED Prescriptions  Medication Sig Dispense Auth. Provider   predniSONE  (STERAPRED UNI-PAK 21 TAB) 10 MG (21) TBPK tablet As directed 21 tablet Kendrick Remigio K, PA-C      PDMP not reviewed this encounter.   Budd Cargo, PA-C 09/16/23 1918

## 2023-09-16 NOTE — Discharge Instructions (Signed)
 You were negative for flu and COVID here.  I believe that you have a viral illness that is triggering your asthma.  Use the albuterol  every 4-6 hours as needed.  Start prednisone  taper.  Do not take NSAIDs with this medication including aspirin, ibuprofen /Advil , naproxen/Aleve.  You can use Mucinex, Tylenol , nasal saline/sinus rinses for additional symptom relief.  If you are not feeling better within a few days or if anything worsens and you have worsening cough, shortness of breath, chest pain you should be seen immediately.

## 2023-09-23 ENCOUNTER — Ambulatory Visit
Admission: RE | Admit: 2023-09-23 | Discharge: 2023-09-23 | Disposition: A | Discharge status: Home / Self Care | Payer: MEDICAID | Source: Ambulatory Visit

## 2023-09-23 ENCOUNTER — Ambulatory Visit
Admission: RE | Admit: 2023-09-23 | Discharge: 2023-09-23 | Disposition: A | Discharge status: Home / Self Care | Payer: MEDICAID | Source: Ambulatory Visit | Attending: Internal Medicine | Admitting: Internal Medicine

## 2023-09-23 DIAGNOSIS — N3000 Acute cystitis without hematuria: Secondary | ICD-10-CM

## 2023-09-23 DIAGNOSIS — Z113 Encounter for screening for infections with a predominantly sexual mode of transmission: Secondary | ICD-10-CM

## 2023-09-23 LAB — POCT URINE PREGNANCY: Preg Test, Ur: NEGATIVE

## 2023-09-23 LAB — POCT URINALYSIS DIP (MANUAL ENTRY)
Bilirubin, UA: NEGATIVE
Blood, UA: NEGATIVE
Glucose, UA: NEGATIVE mg/dL
Ketones, POC UA: NEGATIVE mg/dL
Nitrite, UA: NEGATIVE
Protein Ur, POC: NEGATIVE mg/dL
Spec Grav, UA: 1.025 (ref 1.010–1.025)
Urobilinogen, UA: 0.2 U/dL
pH, UA: 6.5 (ref 5.0–8.0)

## 2023-09-23 MED ORDER — NITROFURANTOIN MONOHYD MACRO 100 MG PO CAPS: 100.0000 mg | ORAL_CAPSULE | Freq: Two times a day (BID) | ORAL | 0 refills | Status: DC

## 2023-09-23 NOTE — ED Triage Notes (Signed)
"  I have an uncomfortable feeling in my vaginal area and I believe it is either yeast infection or UTI". No fever.

## 2023-09-23 NOTE — Discharge Instructions (Addendum)
Labs results within 24-48 hours. Our office will only call if results are abnormal. You can view all result on My Chart.

## 2023-09-23 NOTE — ED Provider Notes (Addendum)
[name]    CSN: [id number] Arrival date & time: 09/23/23  1509      History   Chief Complaint Chief Complaint  Patient presents with   UTI Symptoms    HPI Emily Allen is a 18 y.o. female.   HPI Emily Allen is a 18 y.o. female presents for evaluation of urinary frequency, urgency and dysuria with vaginal discomfort. Patient denies abnormal vaginal discharge, flank pain, nausea, or vomiting. Symptoms present for a few days Patient's last menstrual period was 09/14/2023 (exact date).   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 No pertinent surgical history.  OB History   No obstetric history on file.      Home Medications    Prior to Admission medications   Medication Sig Start Date End Date Taking? Authorizing Provider  nitrofurantoin, macrocrystal-monohydrate, (MACROBID) 100 MG capsule Take 1 capsule (100 mg total) by mouth 2 (two) times daily. 09/23/23  Yes Bing Neighbors, NP  predniSONE (STERAPRED UNI-PAK 21 TAB) 10 MG (21) TBPK tablet As directed 09/16/23  Yes Raspet, Erin K, PA-C  albuterol (PROVENTIL) (2.5 MG/3ML) 0.083% nebulizer solution SMARTSIG:1 Nebule Via Nebulizer Every 4 Hours PRN 04/14/23   [provider]  FLUoxetine (PROZAC) 20 MG capsule TAKE 1 CAPSULE BY MOUTH EVERY DAY Patient not taking: Reported on 06/10/2022 02/08/21    Georges Mouse, NP  levothyroxine (SYNTHROID) 75 MCG tablet Take 1 tablet (75 mcg total) by mouth daily. 12/25/20 12/25/21  Gretchen Short, NP  montelukast (SINGULAIR) 10 MG tablet Take 10 mg by mouth daily. Patient not taking: Reported on 06/10/2022 05/24/20   [provider]  Multiple Vitamin (MULTIVITAMIN) tablet Take 1 tablet by mouth daily. Patient not taking: Reported on 06/10/2022    [provider]  Prenatal Vit-Fe Fumarate-FA (PRENATAL VITAMINS) 28-0.8 MG TABS Take one tablet daily by mouth. Patient not taking: Reported on 09/16/2023 09/26/22   Georges Mouse, NP  sulfamethoxazole-trimethoprim (BACTRIM DS) 800-160 MG tablet Take 1 tablet by mouth 2 (two) times daily for 7 days. 09/25/23 10/02/23  Zenia Resides, MD  SYMBICORT 80-4.5 MCG/ACT inhaler Inhale into the lungs. 06/19/23   [provider]    Family History Family History  Problem Relation Age of Onset   Hyperlipidemia Paternal Grandmother    Thyroid disease Paternal Grandmother     Social History Social History   Tobacco Use   Smoking status: Never    Passive exposure: Yes   Smokeless tobacco: Never  Vaping Use   Vaping status: Some Days   Substances: Nicotine, CBD, Flavoring  Substance Use Topics   Alcohol use: Yes    Comment: socially   Drug use: Not Currently    Types: Marijuana    Comment: daily     Allergies   Patient has no known allergies.   Review of  Systems Review of Systems Pertinent negatives listed in HPI   Physical Exam Triage Vital Signs ED Triage Vitals  Encounter Vitals Group     BP 09/23/23 1520 111/69     Systolic BP Percentile --      Diastolic BP Percentile --      Pulse Rate 09/23/23 1520 79     Resp 09/23/23 1520 20     Temp 09/23/23 1520 97.9 F (36.6 C)     Temp Source 09/23/23 1520 Oral     SpO2 09/23/23 1520 99 %     Weight 09/23/23 1518 230 lb (104.3 kg)     Height 09/23/23 1518 5\' 3"  (1.6 m)     Head Circumference --      Peak  Flow --      Pain Score 09/23/23 1516 10     Pain Loc --      Pain Education --      Exclude from Growth Chart --    No data found.  Updated Vital Signs BP 111/69 (BP Location: Right Arm)   Pulse 79   Temp 97.9 F (36.6 C) (Oral)   Resp 20   Ht 5\' 3"  (1.6 m)   Wt 230 lb (104.3 kg)   LMP 09/14/2023 (Exact Date)   SpO2 99%   BMI 40.74 kg/m   Visual Acuity Right Eye Distance:   Left Eye Distance:   Bilateral Distance:    Right Eye Near:   Left Eye Near:    Bilateral Near:     Physical Exam  General appearance: alert, well developed, well nourished, cooperative and in no distress Head: Normocephalic, without obvious abnormality, atraumatic Respiratory: Respirations even and unlabored, normal respiratory rate Heart: rate and rhythm normal.   CVA:  no flank pain Extremities: No gross deformities Skin: Skin color, texture, turgor normal. No rashes seen  Psych: Appropriate mood and affect.  UC Treatments / Results  Labs (all labs ordered are listed, but only abnormal results are displayed) Labs Reviewed  URINE CULTURE - Abnormal; Notable for the following components:      Result Value   Culture >=100,000 COLONIES/mL PROTEUS MIRABILIS (*)    Organism ID, Bacteria PROTEUS MIRABILIS (*)    All other components within normal limits  POCT URINALYSIS DIP (MANUAL ENTRY) - Abnormal; Notable for the following components:   Clarity, UA cloudy (*)    Leukocytes, UA Trace (*)    All other components within normal limits  CERVICOVAGINAL ANCILLARY ONLY - Abnormal; Notable for the following components:   Candida Vaginitis Positive (*)    Candida Glabrata Positive (*)    All other components within normal limits  POCT URINE PREGNANCY - Normal    EKG   Radiology No results found.  Procedures Procedures (including critical care time)  Medications Ordered in UC Medications - No data to display  Initial Impression / Assessment and Plan / UC Course  I have reviewed the  triage vital signs and the nursing notes.  Pertinent labs & imaging results that were available during my care of the patient were reviewed by me and considered in my medical decision making (see chart for details).      UA abnormal and findings consistent with UTI. Empiric antibiotic treatment initiated.  Encouraged increase intake of water. Urine culture pending. Vaginal Cytology pending to rule out vaginitis and STD. ER if symptoms become severe. Follow-up with PCP if symptoms do not completely resolve.  Final Clinical Impressions(s) / UC Diagnoses  Final diagnoses:  Acute cystitis without hematuria  Screen for STD (sexually transmitted disease)     Discharge Instructions      Labs results within 24-48 hours. Our office will only call if results are abnormal. You can view all result on My Chart.     ED Prescriptions     Medication Sig Dispense Auth. Provider   nitrofurantoin, macrocrystal-monohydrate, (MACROBID) 100 MG capsule Take 1 capsule (100 mg total) by mouth 2 (two) times daily. 10 capsule Bing Neighbors, NP      PDMP not reviewed this encounter.   Bing Neighbors, NP 09/26/23 1223    Bing Neighbors, NP 09/26/23 1224

## 2023-09-24 LAB — CERVICOVAGINAL ANCILLARY ONLY
Bacterial Vaginitis (gardnerella): NEGATIVE
Candida Glabrata: POSITIVE — AB
Candida Vaginitis: POSITIVE — AB
Chlamydia: NEGATIVE
Comment: NEGATIVE
Comment: NEGATIVE
Comment: NEGATIVE
Comment: NEGATIVE
Comment: NEGATIVE
Comment: NORMAL
Neisseria Gonorrhea: NEGATIVE
Trichomonas: NEGATIVE

## 2023-09-25 ENCOUNTER — Telehealth (HOSPITAL_BASED_OUTPATIENT_CLINIC_OR_DEPARTMENT_OTHER): Payer: Self-pay

## 2023-09-25 LAB — URINE CULTURE
Culture: >=100000 — AB
Special Requests: NORMAL

## 2023-09-25 MED ORDER — SULFAMETHOXAZOLE-TRIMETHOPRIM 800-160 MG PO TABS: 1.0000 | ORAL_TABLET | Freq: Two times a day (BID) | ORAL | 0 refills | Status: AC

## 2023-09-25 NOTE — Telephone Encounter (Signed)
Per protocol, pt to dc Macrobid and begin treatment with Bactrim. Attempted to reach patient x1. LMV.  Rx sent to pharmacy on file.

## 2023-10-11 ENCOUNTER — Ambulatory Visit: Payer: Self-pay

## 2023-10-12 ENCOUNTER — Ambulatory Visit
Admission: RE | Admit: 2023-10-12 | Discharge: 2023-10-12 | Disposition: A | Discharge status: Home / Self Care | Payer: MEDICAID | Source: Ambulatory Visit

## 2023-10-12 ENCOUNTER — Ambulatory Visit
Admission: RE | Admit: 2023-10-12 | Discharge: 2023-10-12 | Disposition: A | Discharge status: Home / Self Care | Payer: MEDICAID | Attending: Physician Assistant | Admitting: Physician Assistant

## 2023-10-12 DIAGNOSIS — J069 Acute upper respiratory infection, unspecified: Secondary | ICD-10-CM | POA: Diagnosis not present

## 2023-10-12 LAB — POCT RAPID STREP A (OFFICE): Rapid Strep A Screen: NEGATIVE

## 2023-10-12 NOTE — ED Triage Notes (Signed)
"  This started with Cough and then sore throat about 4 days ago". No fever. "Hoarse voice". No sob. No wheezing. No rash. "I have not had the Flu or COVID19 vaccines.

## 2023-10-12 NOTE — ED Provider Notes (Signed)
 [name]    CSN: [id number] Arrival date & time: 10/12/23  1455      History   Chief Complaint Chief Complaint  Patient presents with   Sore Throat   Asthma Exacerbation    HPI Emily Allen is a 18 y.o. female.   Patient here today for evaluation of cough and sore throat that started about 4 days ago.  She has not any fever.  She reports some hoarse voice but no shortness of breath or wheezing.  She denies any vomiting or diarrhea.  The history is provided by the patient.  Sore Throat Pertinent negatives include no abdominal pain and no shortness of breath.    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 No pertinent surgical history.  OB History   No obstetric history on file.      Home Medications    Prior to Admission medications   Medication Sig Start Date End Date Taking? Authorizing Provider  albuterol  (PROVENTIL ) (2.5 MG/3ML) 0.083% nebulizer solution SMARTSIG:1 Nebule Via Nebulizer Every 4 Hours PRN 04/14/23   [provider]  FLUoxetine  (PROZAC ) 20 MG capsule TAKE 1 CAPSULE BY MOUTH EVERY DAY Patient not taking: Reported on 06/10/2022 02/08/21   Marijean Shouts, NP  levothyroxine  (SYNTHROID ) 75 MCG tablet Take 1 tablet (75 mcg total) by mouth daily. 12/25/20 12/25/21  Candee Cha, NP  montelukast  (SINGULAIR ) 10 MG tablet Take 10 mg by  mouth daily. Patient not taking: Reported on 06/10/2022 05/24/20   [provider]  Multiple Vitamin (MULTIVITAMIN) tablet Take 1 tablet by mouth daily. Patient not taking: Reported on 06/10/2022    [provider]  nitrofurantoin , macrocrystal-monohydrate, (MACROBID ) 100 MG capsule Take 1 capsule (100 mg total) by mouth 2 (two) times daily. 09/23/23   Buena Carmine, NP  predniSONE  (STERAPRED UNI-PAK 21 TAB) 10 MG (21) TBPK tablet As directed 09/16/23   Raspet, Betsey Brow, PA-C  Prenatal Vit-Fe Fumarate-FA (PRENATAL VITAMINS) 28-0.8 MG TABS Take one tablet daily by mouth. Patient not taking: Reported on 09/16/2023 09/26/22   Marijean Shouts, NP  SYMBICORT 80-4.5 MCG/ACT inhaler Inhale into the lungs. 06/19/23   [provider]    Family History Family History  Problem Relation Age of Onset   Hyperlipidemia Paternal Grandmother    Thyroid  disease Paternal Grandmother     Social History Social History   Tobacco Use   Smoking status: Never    Passive exposure: Yes   Smokeless tobacco: Never  Vaping Use   Vaping status: Former   Substances: Nicotine, CBD, Flavoring  Substance Use Topics   Alcohol use: Yes    Comment: socially   Drug use: Yes    Types: Marijuana    Comment: daily     Allergies   Patient has no known allergies.   Review of Systems Review of  Systems  Constitutional:  Negative for chills and fever.  HENT:  Positive for congestion and sore throat. Negative for ear pain.   Eyes:  Negative for discharge and redness.  Respiratory:  Positive for cough. Negative for shortness of breath and wheezing.   Gastrointestinal:  Negative for abdominal pain, diarrhea, nausea and vomiting.     Physical Exam Triage Vital Signs ED Triage Vitals  Encounter Vitals Group     BP 10/12/23 1514 110/75     Systolic BP Percentile --      Diastolic BP Percentile --      Pulse Rate 10/12/23 1514 84     Resp 10/12/23 1514 20     Temp 10/12/23 1514 98.5  F (36.9 C)     Temp Source 10/12/23 1514 Oral     SpO2 10/12/23 1514 96 %     Weight 10/12/23 1512 230 lb (104.3 kg)     Height 10/12/23 1512 5\' 3"  (1.6 m)     Head Circumference --      Peak Flow --      Pain Score 10/12/23 1510 10     Pain Loc --      Pain Education --      Exclude from Growth Chart --    No data found.  Updated Vital Signs BP 110/75 (BP Location: Right Arm)   Pulse 84   Temp 98.5 F (36.9 C) (Oral)   Resp 20   Ht 5\' 3"  (1.6 m)   Wt 230 lb (104.3 kg)   LMP 10/11/2023 (Exact Date)   SpO2 96%   BMI 40.74 kg/m   Visual Acuity Right Eye Distance:   Left Eye Distance:   Bilateral Distance:    Right Eye Near:   Left Eye Near:    Bilateral Near:     Physical Exam Vitals and nursing note reviewed.  Constitutional:      General: She is not in acute distress.    Appearance: Normal appearance. She is not ill-appearing.  HENT:     Head: Normocephalic and atraumatic.     Right Ear: Tympanic membrane normal.     Left Ear: Tympanic membrane normal.     Nose: Congestion present.     Mouth/Throat:     Mouth: Mucous membranes are moist.     Pharynx: No oropharyngeal exudate or posterior oropharyngeal erythema.  Eyes:     Conjunctiva/sclera: Conjunctivae normal.  Cardiovascular:     Rate and Rhythm: Normal rate and regular rhythm.     Heart sounds: Normal heart sounds. No murmur heard. Pulmonary:     Effort: Pulmonary effort is normal. No respiratory distress.     Breath sounds: Normal breath sounds. No wheezing, rhonchi or rales.  Skin:    General: Skin is warm and dry.  Neurological:     Mental Status: She is alert.  Psychiatric:        Mood and Affect: Mood normal.        Thought Content: Thought content normal.      UC Treatments / Results  Labs (all labs ordered are listed, but only abnormal results are displayed) Labs Reviewed  POCT RAPID STREP A (OFFICE) - Normal  CULTURE, GROUP A STREP [facility])    EKG   Radiology No results  found.  Procedures Procedures (including critical care time)  Medications Ordered in UC Medications - No data to display  Initial Impression / Assessment and Plan / UC Course  I have reviewed the triage vital  signs and the nursing notes.  Pertinent labs & imaging results that were available during my care of the patient were reviewed by me and considered in my medical decision making (see chart for details).    Rapid strep screen negative in office.  Will order throat culture.  Suspect likely viral etiology of upper respiratory symptoms and encouraged symptomatic treatment, increase fluids and rest with follow-up if no gradual improvement or with any further concerns.  Final Clinical Impressions(s) / UC Diagnoses   Final diagnoses:  Acute upper respiratory infection   Discharge Instructions   None    ED Prescriptions   None    PDMP not reviewed this encounter.   Vernestine Gondola, PA-C 10/12/23 1722

## 2023-10-15 LAB — CULTURE, GROUP A STREP (THRC)

## 2023-10-21 ENCOUNTER — Encounter: Payer: Self-pay | Admitting: Family Medicine

## 2023-10-21 ENCOUNTER — Ambulatory Visit (INDEPENDENT_AMBULATORY_CARE_PROVIDER_SITE_OTHER): Payer: MEDICAID | Admitting: Family Medicine

## 2023-10-21 VITALS — BP 126/84 | HR 66 | Temp 97.5°F | Resp 18 | Ht 63.0 in | Wt 268.2 lb

## 2023-10-21 DIAGNOSIS — Z6841 Body Mass Index (BMI) 40.0 and over, adult: Secondary | ICD-10-CM

## 2023-10-21 DIAGNOSIS — E063 Autoimmune thyroiditis: Secondary | ICD-10-CM

## 2023-10-21 DIAGNOSIS — F419 Anxiety disorder, unspecified: Secondary | ICD-10-CM | POA: Diagnosis not present

## 2023-10-21 DIAGNOSIS — E66813 Obesity, class 3: Secondary | ICD-10-CM

## 2023-10-21 DIAGNOSIS — F32A Depression, unspecified: Secondary | ICD-10-CM

## 2023-10-21 DIAGNOSIS — J4520 Mild intermittent asthma, uncomplicated: Secondary | ICD-10-CM | POA: Diagnosis not present

## 2023-10-21 DIAGNOSIS — Z7689 Persons encountering health services in other specified circumstances: Secondary | ICD-10-CM

## 2023-10-21 MED ORDER — MONTELUKAST SODIUM 10 MG PO TABS: 10.0000 mg | ORAL_TABLET | Freq: Every day | ORAL | 0 refills | Status: DC

## 2023-10-21 MED ORDER — FLUOXETINE HCL 20 MG PO CAPS: ORAL_CAPSULE | ORAL | 0 refills | Status: DC

## 2023-10-21 MED ORDER — FLUTICASONE PROPIONATE HFA 110 MCG/ACT IN AERO: 2.0000 | INHALATION_SPRAY | Freq: Two times a day (BID) | RESPIRATORY_TRACT | 3 refills | Status: DC

## 2023-10-21 MED ORDER — LEVOTHYROXINE SODIUM 75 MCG PO TABS: 75.0000 ug | ORAL_TABLET | Freq: Every day | ORAL | 3 refills | Status: DC

## 2023-10-21 NOTE — Progress Notes (Unsigned)
New Patient Office Visit  Subjective    Patient ID: Emily Allen, female    DOB: [birth year]  Age: 18 y.o. MRN: [id number]  CC:  Chief Complaint  Patient presents with   Follow-up    HPI [name] presents to establish care   Outpatient Encounter Medications as of 10/21/2023  Medication Sig   albuterol (PROVENTIL) (2.5 MG/3ML) 0.083% nebulizer solution SMARTSIG:1 Nebule Via Nebulizer Every 4 Hours PRN   SYMBICORT 80-4.5 MCG/ACT inhaler Inhale into the lungs.   FLUoxetine (PROZAC) 20 MG capsule TAKE 1 CAPSULE BY MOUTH EVERY DAY (Patient not taking: Reported on 06/10/2022)   levothyroxine (SYNTHROID) 75 MCG tablet Take 1 tablet (75 mcg total) by mouth daily.   montelukast (SINGULAIR) 10 MG tablet Take 10 mg by mouth daily. (Patient not taking: Reported on 06/10/2022)   Multiple Vitamin (MULTIVITAMIN) tablet Take 1 tablet by mouth daily. (Patient not taking: Reported on 06/10/2022)   nitrofurantoin, macrocrystal-monohydrate, (MACROBID) 100 MG capsule Take 1 capsule (100 mg total) by mouth 2 (two) times daily. (Patient not taking: Reported on 10/21/2023)   predniSONE (STERAPRED UNI-PAK 21 TAB) 10 MG (21) TBPK tablet As directed (Patient not taking: Reported on 10/21/2023)   Prenatal Vit-Fe Fumarate-FA (PRENATAL VITAMINS) 28-0.8 MG TABS Take one tablet daily by mouth. (Patient not taking: Reported on 09/16/2023)   No facility-administered encounter medications on file as of 10/21/2023.    Past Medical History:  Diagnosis Date   Anxiety    Phreesia 04/27/2020   Asthma    Depression    Phreesia 04/27/2020   Hyperlipidemia    Phreesia 04/27/2020   Hypothyroidism    Insulin resistance    Thyroid disease    Phreesia 04/27/2020    History reviewed. No pertinent surgical history.  Family History  Problem Relation Age of Onset   Hyperlipidemia Paternal Grandmother    Thyroid disease Paternal Grandmother     Social History   Socioeconomic History   Marital status:  Single    Spouse name: Not on file   Number of children: Not on file   Years of education: Not on file   Highest education level: Not on file  Occupational History   Not on file  Tobacco Use   Smoking status: Never    Passive exposure: Yes   Smokeless tobacco: Never  Vaping Use   Vaping status: Former   Substances: Nicotine, CBD, Flavoring  Substance and Sexual Activity   Alcohol use: Yes    Comment: socially   Drug use: Yes    Types: Marijuana    Comment: daily   Sexual activity: Yes    Birth control/protection: None    Comment: Last encounter: 09-21-2023  Other Topics Concern   Not on file  Social History Narrative   [name] 9th grade. In class and virtual.      Lives with [name] and brother.    Social Drivers of Corporate investment banker Strain: Low Risk  (10/21/2023)   Overall Financial Resource Strain (CARDIA)    Difficulty of Paying Living Expenses: Not hard at all  Food Insecurity: No Food Insecurity (10/21/2023)   Hunger Vital Sign    Worried About Running Out of Food in the Last Year: Never true    Ran Out of Food in the Last Year: Never true  Transportation Needs: No Transportation Needs (10/21/2023)   PRAPARE - Administrator, Civil Service (Medical): No    Lack of Transportation (Non-Medical): No  Physical Activity: Sufficiently Active (  10/21/2023)   Exercise Vital Sign    Days of Exercise per Week: 5 days    Minutes of Exercise per Session: 30 min  Stress: No Stress Concern Present (10/21/2023)   [employer] of Occupational Health - Occupational Stress Questionnaire    Feeling of Stress : Not at all  Social Connections: Moderately Isolated (10/21/2023)   Social Connection and Isolation Panel [NHANES]    Frequency of Communication with Friends and Family: More than three times a week    Frequency of Social Gatherings with Friends and Family: More than three times a week    Attends Religious Services: Never    Database administrator or  Organizations: No    Attends Banker Meetings: Never    Marital Status: Living with partner  Intimate Partner Violence: Not At Risk (10/21/2023)   Humiliation, Afraid, Rape, and Kick questionnaire    Fear of Current or Ex-Partner: No    Emotionally Abused: No    Physically Abused: No    Sexually Abused: No    ROS      Objective   BP 126/84 (BP Location: Left Arm, Patient Position: Sitting, Cuff Size: Large)   Pulse 66   Temp (!) 97.5 F (36.4 C) (Oral)   Resp 18   Ht 5\' 3"  (1.6 m)   Wt 268 lb 3.2 oz (121.7 kg)   LMP 10/11/2023 (Exact Date)   SpO2 95%   BMI 47.51 kg/m   Physical Exam  {Labs (Optional):23779}    Assessment & Plan:   Mild intermittent asthma without complication  Hypothyroidism, acquired, autoimmune  Anxiety and depression     No follow-ups on file.   Tommie Raymond, MD

## 2023-10-22 ENCOUNTER — Encounter: Payer: Self-pay | Admitting: Family Medicine

## 2023-11-24 ENCOUNTER — Ambulatory Visit (INDEPENDENT_AMBULATORY_CARE_PROVIDER_SITE_OTHER): Payer: MEDICAID | Admitting: Primary Care

## 2023-11-24 VITALS — BP 122/79 | HR 90 | Wt 269.2 lb

## 2023-11-24 DIAGNOSIS — B379 Candidiasis, unspecified: Secondary | ICD-10-CM

## 2023-11-24 MED ORDER — CLOTRIMAZOLE-BETAMETHASONE 1-0.05 % EX CREA
1.0000 | TOPICAL_CREAM | Freq: Every day | CUTANEOUS | 0 refills | Status: DC
Start: 2023-11-24 — End: 2024-03-02

## 2023-11-24 NOTE — Patient Instructions (Signed)
 Yeast (Candida Auris) Infection: What to Know Candida auris infection is an infection with a type of yeast. Yeast is a type of fungus. This infection is treated with antifungal medicines. The yeast can be resistant to common antifungal medicines. This means that certain antifungals will not work to treat a Candida auris infection. Candida auris can be on your skin or in your nose without causing problems. This is called colonization or being colonized. If you're colonized with the yeast, you don't have an infection and you don't have symptoms. But, if the yeast gets into your body through a cut, a sore, or a medical device or tube, it can cause an infection. Candida auris infection can cause big problems in people who have a weak body defense system. This is also called the immune system. What are the causes? Candida auris infection is caused by yeast. You can get this yeast by: Touching something with yeast on it and then touching your mouth, nose, or eyes. Having a cut or wound where yeast can get into your body. Having a device in your body, such as: An IV. A soft tube called a catheter in your bladder. A breathing tube. A feeding tube. Candida auris can spread easily in health care clinics and hospitals. What increases the risk? You're more likely to get this infection if: You're in the hospital for a long time. The risk may be higher if you're in an intensive care unit (ICU) or long-term care facility. You have a tube or medical device in your body. You have long-term (chronic) kidney disease or are on dialysis. You have a weak immune system. You've been on antibiotics or antifungals. What are the signs or symptoms? Candida auris can cause infections in different parts of the body. The symptoms depend on where the infection is and how bad it is. You may have these signs of infection: Fever. Chills. Tiredness. How is this diagnosed? Your provider will talk with you about your medical  history and do a physical exam. Tests might be done to check for the yeast on your skin. A swab is used to rub your skin and get cells for testing. You may also have tests of your blood or pee (urine). How is this treated? An antifungal medicine is used to kill the yeast. Candida auris is resistant to many types of antifungals, but a medicine will be found to treat your infection. Follow these instructions at home: General instructions  Take your medicines only as told. Wash your hands with soap and water for at least 20 seconds. If you don't have soap and water, use hand sanitizer. Prevent the spread of germs. How is this prevented? Things your health care team can do to stop the spread of germs: Clean and disinfect your room and the things used for your care. Cleaning with soap and water removes germs and dirt. Disinfecting means killing germs. Put you in a private room. Wear gowns and gloves when they care for you. Wash their hands before and after they care for you. Things you can do to stop the spread of germs: Wash your hands often. Try to not be in close contact with others. Do not share personal items. These include: Towels. Razors. Toothbrushes. Bedding. Wash towels, bedding, and clothes in the washing machine with detergent and hot water. Dry them in a hot dryer. Clean and disinfect objects that are touched a lot, like water taps and faucets, handles, and door knobs. Cleaners with bleach or alcohol help  to kill yeast. Read the product label to make sure it will kill yeast. Where to find more information To learn more: Go to DiningCalendar.de. Click the search box. Type "About C. Auris" in the search box. Contact a provider if: You have any new symptoms. Your symptoms get worse. This information is not intended to replace advice given to you by your health care provider. Make sure you discuss any questions you have with your health care provider. Document Revised: 01/22/2023  Document Reviewed: 01/22/2023 Elsevier Patient Education  2024 [employer].

## 2023-11-24 NOTE — Progress Notes (Signed)
 [facility]   Subjective:   Emily Allen is a 18 y.o. No obstetric history on file. female complains of vaginal itching outside only. Red raised rash.STI Risk: Very low risk of STD exposure.   Other associated symptoms: blisters, local irritation, and pain.Menstrual pattern: She had been bleeding regularly. Contraception: none.  denies  changes in soaps, detergents coinciding with the onset of her symptoms.  She has not previously self treated or been under treatment by another provider for these symptoms.     Gynecologic History No LMP recorded. Contraception: none  Past Medical History:  Diagnosis Date   Anxiety    Phreesia 04/27/2020   Asthma    Depression    Phreesia 04/27/2020   Hyperlipidemia    Phreesia 04/27/2020   Hypothyroidism    Insulin resistance    Thyroid disease    Phreesia 04/27/2020    No past surgical history on file.  Current Outpatient Medications on File Prior to Visit  Medication Sig Dispense Refill   albuterol (PROVENTIL) (2.5 MG/3ML) 0.083% nebulizer solution SMARTSIG:1 Nebule Via Nebulizer Every 4 Hours PRN     levothyroxine (SYNTHROID) 75 MCG tablet Take 1 tablet (75 mcg total) by mouth daily. 30 tablet 3   FLUoxetine (PROZAC) 20 MG capsule TAKE 1 CAPSULE BY MOUTH EVERY DAY (Patient not taking: Reported on 11/24/2023) 90 capsule 0   fluticasone (FLOVENT HFA) 110 MCG/ACT inhaler Inhale 2 puffs into the lungs 2 (two) times daily. (Patient not taking: Reported on 11/24/2023) 1 each 3   montelukast (SINGULAIR) 10 MG tablet Take 1 tablet (10 mg total) by mouth daily. (Patient not taking: Reported on 11/24/2023) 90 tablet 0   Multiple Vitamin (MULTIVITAMIN) tablet Take 1 tablet by mouth daily. (Patient not taking: Reported on 11/24/2023)     nitrofurantoin, macrocrystal-monohydrate, (MACROBID) 100 MG capsule Take 1 capsule (100 mg total) by mouth 2 (two) times daily. (Patient not taking: Reported on 11/24/2023) 10 capsule 0   predniSONE  (STERAPRED UNI-PAK 21 TAB) 10 MG (21) TBPK tablet As directed (Patient not taking: Reported on 11/24/2023) 21 tablet 0   Prenatal Vit-Fe Fumarate-FA (PRENATAL VITAMINS) 28-0.8 MG TABS Take one tablet daily by mouth. (Patient not taking: Reported on 11/24/2023) 90 tablet 2   SYMBICORT 80-4.5 MCG/ACT inhaler Inhale into the lungs. (Patient not taking: Reported on 11/24/2023)     No current facility-administered medications on file prior to visit.    No Known Allergies  Social History   Socioeconomic History   Marital status: Single    Spouse name: Not on file   Number of children: Not on file   Years of education: Not on file   Highest education level: Not on file  Occupational History   Not on file  Tobacco Use   Smoking status: Never    Passive exposure: Yes   Smokeless tobacco: Never  Vaping Use   Vaping status: Former   Substances: Nicotine, CBD, Flavoring  Substance and Sexual Activity   Alcohol use: Yes    Comment: socially   Drug use: Yes    Types: Marijuana    Comment: daily   Sexual activity: Yes    Birth control/protection: None    Comment: Last encounter: 09-21-2023  Other Topics Concern   Not on file  Social History Narrative   [name] 9th grade. In class and virtual.      Lives with [name] and brother.    Social Drivers of Corporate investment banker Strain: Low Risk  (  10/21/2023)   Overall Financial Resource Strain (CARDIA)    Difficulty of Paying Living Expenses: Not hard at all  Food Insecurity: No Food Insecurity (10/21/2023)   Hunger Vital Sign    Worried About Running Out of Food in the Last Year: Never true    Ran Out of Food in the Last Year: Never true  Transportation Needs: No Transportation Needs (10/21/2023)   PRAPARE - Administrator, Civil Service (Medical): No    Lack of Transportation (Non-Medical): No  Physical Activity: Sufficiently Active (10/21/2023)   Exercise Vital Sign    Days of Exercise per Week: 5 days    Minutes of  Exercise per Session: 30 min  Stress: No Stress Concern Present (10/21/2023)   [employer] of Occupational Health - Occupational Stress Questionnaire    Feeling of Stress : Not at all  Social Connections: Moderately Isolated (10/21/2023)   Social Connection and Isolation Panel [NHANES]    Frequency of Communication with Friends and Family: More than three times a week    Frequency of Social Gatherings with Friends and Family: More than three times a week    Attends Religious Services: Never    Database administrator or Organizations: No    Attends Banker Meetings: Never    Marital Status: Living with partner  Intimate Partner Violence: Not At Risk (10/21/2023)   Humiliation, Afraid, Rape, and Kick questionnaire    Fear of Current or Ex-Partner: No    Emotionally Abused: No    Physically Abused: No    Sexually Abused: No    Family History  Problem Relation Age of Onset   Hyperlipidemia Paternal Grandmother    Thyroid disease Paternal Grandmother     The following portions of the patient's history were reviewed and updated as appropriate: allergies, current medications, past family history, past medical history, past social history, past surgical history and problem list.  Review of Systems Comprehensive ROS Pertinent positive and negative noted in HPI    Objective:  BP 122/79 (BP Location: Right Arm, Patient Position: Sitting, Cuff Size: Large)   Pulse 90   Wt 269 lb 3.2 oz (122.1 kg)   SpO2 96%   BMI 47.69 kg/m   Physical Exam Vitals reviewed.  Constitutional:      Appearance: Normal appearance. She is obese.  HENT:     Head: Normocephalic.     Right Ear: Tympanic membrane, ear canal and external ear normal.     Left Ear: Tympanic membrane, ear canal and external ear normal.     Nose: Nose normal.     Mouth/Throat:     Mouth: Mucous membranes are moist.  Eyes:     Extraocular Movements: Extraocular movements intact.     Pupils: Pupils are equal,  round, and reactive to light.  Cardiovascular:     Rate and Rhythm: Normal rate.  Pulmonary:     Effort: Pulmonary effort is normal.     Breath sounds: Normal breath sounds.  Abdominal:     General: Bowel sounds are normal.     Palpations: Abdomen is soft.  Musculoskeletal:        General: Normal range of motion.     Cervical back: Normal range of motion.  Skin:    General: Skin is warm and dry.     Findings: Rash present.     Comments: Thighs & vagina labia  Neurological:     Mental Status: She is alert and oriented to person, place,  and time.  Psychiatric:        Mood and Affect: Mood normal.        Behavior: Behavior normal.        Thought Content: Thought content normal.      Assessment:  Akeisha was seen today for rash and menorrhagia.  Diagnoses and all orders for this visit:  Yeast infection -     clotrimazole-betamethasone (LOTRISONE) cream; Apply 1 Application topically daily.    Reviewed expectations re: course of current medical issues. Questions answered. Outlined signs and symptoms indicating need for more acute intervention. Patient verbalized understanding. This note has been created with Education officer, environmental. Any transcriptional errors are unintentional.   Grayce Sessions, NP 11/24/2023, 3:43 PM

## 2023-12-02 ENCOUNTER — Encounter: Payer: Self-pay | Admitting: Family Medicine

## 2023-12-02 ENCOUNTER — Ambulatory Visit (INDEPENDENT_AMBULATORY_CARE_PROVIDER_SITE_OTHER): Payer: MEDICAID | Admitting: Family Medicine

## 2023-12-02 ENCOUNTER — Other Ambulatory Visit (HOSPITAL_COMMUNITY)
Admission: RE | Admit: 2023-12-02 | Discharge: 2023-12-02 | Disposition: A | Discharge status: Home / Self Care | Payer: MEDICAID | Source: Ambulatory Visit | Attending: Family Medicine | Admitting: Family Medicine

## 2023-12-02 VITALS — BP 113/77 | HR 70 | Temp 97.6°F | Resp 18 | Ht 63.5 in | Wt 268.8 lb

## 2023-12-02 DIAGNOSIS — B379 Candidiasis, unspecified: Secondary | ICD-10-CM | POA: Diagnosis not present

## 2023-12-02 DIAGNOSIS — Z113 Encounter for screening for infections with a predominantly sexual mode of transmission: Secondary | ICD-10-CM

## 2023-12-02 MED ORDER — FLUCONAZOLE 150 MG PO TABS: ORAL_TABLET | ORAL | 0 refills | Status: DC

## 2023-12-03 LAB — CERVICOVAGINAL ANCILLARY ONLY
Bacterial Vaginitis (gardnerella): NEGATIVE
Candida Glabrata: POSITIVE — AB
Candida Vaginitis: POSITIVE — AB
Chlamydia: NEGATIVE
Comment: NEGATIVE
Comment: NEGATIVE
Comment: NEGATIVE
Comment: NEGATIVE
Comment: NEGATIVE
Comment: NORMAL
Neisseria Gonorrhea: NEGATIVE
Trichomonas: NEGATIVE

## 2023-12-04 ENCOUNTER — Encounter: Payer: Self-pay | Admitting: Family Medicine

## 2023-12-04 NOTE — Progress Notes (Signed)
 Established Patient Office Visit  Subjective    Patient ID: Emily Allen, female    DOB: 04-07-2006  Age: 18 y.o. MRN: [id number]  CC:  Chief Complaint  Patient presents with   Follow-up    6 week, rash on vaginal and the cream she got for it is not working    HPI Emily Allen presents with complaint of vaginal/perineal itching. She was seen recently at [facility] and given cream to apply externally but she reports her sx persist.   Outpatient Encounter Medications as of 12/02/2023  Medication Sig   albuterol (PROVENTIL) (2.5 MG/3ML) 0.083% nebulizer solution SMARTSIG:1 Nebule Via Nebulizer Every 4 Hours PRN   clotrimazole-betamethasone (LOTRISONE) cream Apply 1 Application topically daily.   fluconazole (DIFLUCAN) 150 MG tablet Take 1 po day 1, day 4 and day 7   levothyroxine (SYNTHROID) 75 MCG tablet Take 1 tablet (75 mcg total) by mouth daily.   FLUoxetine (PROZAC) 20 MG capsule TAKE 1 CAPSULE BY MOUTH EVERY DAY (Patient not taking: Reported on 11/24/2023)   fluticasone (FLOVENT HFA) 110 MCG/ACT inhaler Inhale 2 puffs into the lungs 2 (two) times daily. (Patient not taking: Reported on 11/24/2023)   montelukast (SINGULAIR) 10 MG tablet Take 1 tablet (10 mg total) by mouth daily. (Patient not taking: Reported on 11/24/2023)   Multiple Vitamin (MULTIVITAMIN) tablet Take 1 tablet by mouth daily. (Patient not taking: Reported on 11/24/2023)   nitrofurantoin, macrocrystal-monohydrate, (MACROBID) 100 MG capsule Take 1 capsule (100 mg total) by mouth 2 (two) times daily. (Patient not taking: Reported on 11/24/2023)   predniSONE (STERAPRED UNI-PAK 21 TAB) 10 MG (21) TBPK tablet As directed (Patient not taking: Reported on 11/24/2023)   Prenatal Vit-Fe Fumarate-FA (PRENATAL VITAMINS) 28-0.8 MG TABS Take one tablet daily by mouth. (Patient not taking: Reported on 11/24/2023)   SYMBICORT 80-4.5 MCG/ACT inhaler Inhale into the lungs. (Patient not taking: Reported on 11/24/2023)   No  facility-administered encounter medications on file as of 12/02/2023.    Past Medical History:  Diagnosis Date   Anxiety    Phreesia 04/27/2020   Asthma    Depression    Phreesia 04/27/2020   Hyperlipidemia    Phreesia 04/27/2020   Hypothyroidism    Insulin resistance    Thyroid disease    Phreesia 04/27/2020    History reviewed. No pertinent surgical history.  Family History  Problem Relation Age of Onset   Hyperlipidemia Paternal Grandmother    Thyroid disease Paternal Grandmother     Social History   Socioeconomic History   Marital status: Single    Spouse name: Not on file   Number of children: Not on file   Years of education: Not on file   Highest education level: Not on file  Occupational History   Not on file  Tobacco Use   Smoking status: Never    Passive exposure: Yes   Smokeless tobacco: Never  Vaping Use   Vaping status: Former   Substances: Nicotine, CBD, Flavoring  Substance and Sexual Activity   Alcohol use: Yes    Comment: socially   Drug use: Yes    Types: Marijuana    Comment: daily   Sexual activity: Yes    Birth control/protection: None    Comment: Last encounter: 09-21-2023  Other Topics Concern   Not on file  Social History Narrative   Emily Allen 9th grade. In class and virtual.      Lives with Emily Allen and brother.    Social Drivers of Dispensing optician  Resource Strain: Low Risk  (10/21/2023)   Overall Financial Resource Strain (CARDIA)    Difficulty of Paying Living Expenses: Not hard at all  Food Insecurity: No Food Insecurity (10/21/2023)   Hunger Vital Sign    Worried About Running Out of Food in the Last Year: Never true    Ran Out of Food in the Last Year: Never true  Transportation Needs: No Transportation Needs (10/21/2023)   PRAPARE - Administrator, Civil Service (Medical): No    Lack of Transportation (Non-Medical): No  Physical Activity: Sufficiently Active (10/21/2023)   Exercise Vital Sign    Days of  Exercise per Week: 5 days    Minutes of Exercise per Session: 30 min  Stress: No Stress Concern Present (10/21/2023)   Emily Allen of Occupational Health - Occupational Stress Questionnaire    Feeling of Stress : Not at all  Social Connections: Moderately Isolated (10/21/2023)   Social Connection and Isolation Panel [NHANES]    Frequency of Communication with Friends and Family: More than three times a week    Frequency of Social Gatherings with Friends and Family: More than three times a week    Attends Religious Services: Never    Database administrator or Organizations: No    Attends Banker Meetings: Never    Marital Status: Living with partner  Intimate Partner Violence: Not At Risk (10/21/2023)   Humiliation, Afraid, Rape, and Kick questionnaire    Fear of Current or Ex-Partner: No    Emotionally Abused: No    Physically Abused: No    Sexually Abused: No    Review of Systems  All other systems reviewed and are negative.       Objective    BP 113/77   Pulse 70   Temp 97.6 F (36.4 C) (Oral)   Resp 18   Ht 5' 3.5" (1.613 m)   Wt 268 lb 12.8 oz (121.9 kg)   LMP 11/22/2023   SpO2 96%   BMI 46.87 kg/m   Physical Exam Vitals and nursing note reviewed.  Constitutional:      General: She is not in acute distress. Cardiovascular:     Rate and Rhythm: Normal rate and regular rhythm.  Pulmonary:     Effort: Pulmonary effort is normal.     Breath sounds: Normal breath sounds.  Neurological:     General: No focal deficit present.     Mental Status: She is alert and oriented to person, place, and time.         Assessment & Plan:   Screening for STDs (sexually transmitted diseases) -     Cervicovaginal ancillary only  Candida infection  Other orders -     Fluconazole; Take 1 po day 1, day 4 and day 7  Dispense: 3 tablet; Refill: 0     Return if symptoms worsen or fail to improve.   Tommie Raymond, MD

## 2023-12-08 ENCOUNTER — Encounter (INDEPENDENT_AMBULATORY_CARE_PROVIDER_SITE_OTHER): Payer: Self-pay

## 2023-12-14 ENCOUNTER — Encounter: Payer: Self-pay | Admitting: *Deleted

## 2023-12-14 ENCOUNTER — Ambulatory Visit: Payer: Self-pay

## 2023-12-14 ENCOUNTER — Ambulatory Visit
Admission: EM | Admit: 2023-12-14 | Discharge: 2023-12-14 | Disposition: A | Discharge status: Home / Self Care | Payer: MEDICAID | Attending: Emergency Medicine | Admitting: Emergency Medicine

## 2023-12-14 ENCOUNTER — Other Ambulatory Visit: Payer: Self-pay

## 2023-12-14 DIAGNOSIS — J302 Other seasonal allergic rhinitis: Secondary | ICD-10-CM

## 2023-12-14 DIAGNOSIS — R0981 Nasal congestion: Secondary | ICD-10-CM

## 2023-12-14 MED ORDER — CETIRIZINE HCL 10 MG PO TABS: 10.0000 mg | ORAL_TABLET | Freq: Every day | ORAL | 1 refills | Status: DC

## 2023-12-14 MED ORDER — IPRATROPIUM BROMIDE 0.03 % NA SOLN: 2.0000 | Freq: Two times a day (BID) | NASAL | 1 refills | Status: DC

## 2023-12-14 NOTE — Discharge Instructions (Addendum)
 Today you were evaluated for nasal congestion which is most likely related to seasonal weather change and pollen  Please start daily allergy medicine such as Claritin or Zyrtec to help minimize symptoms  Take daily Zyrtec before bed until weather is consistently hot, this helps to minimize all allergy symptoms  You may use ipratropium nasal spray which has been prescribed for you to further help clear out congestion  Would recommend saline nasal spray for moisturize the nose as needed  The burning sensation from your nose is due to nasal dryness, the saline nasal spray will help with this  May coat nose twice daily as needed with petroleum jelly or similar product  May follow-up as needed

## 2023-12-14 NOTE — ED Provider Notes (Signed)
 [name]    CSN: [id number] Arrival date & time: 12/14/23  1255      History   Chief Complaint Chief Complaint  Patient presents with   Nasal Congestion    HPI Emily Allen is a 18 y.o. female.   Patient presents for evaluation of nasal congestion, postnasal drip, sore throat and a mild nonproductive cough present for 2 to 3 days.  Endorses a burning sensation to the nostrils.  No known sick contacts.  Tolerating food and liquids.  Has attempted use of Benadryl which was ineffective, declines viral testing.  Denies fever, shortness of breath, wheezing or abdominal symptoms.    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 No pertinent surgical history.  OB History   No obstetric history on file.      Home Medications    Prior to Admission medications   Medication Sig Start Date End Date Taking? Authorizing Provider  albuterol (PROVENTIL) (2.5 MG/3ML) 0.083% nebulizer solution SMARTSIG:1 Nebule Via Nebulizer Every 4 Hours PRN 04/14/23  Yes [provider]  clotrimazole-betamethasone (LOTRISONE) cream Apply 1 Application topically daily. 11/24/23  Yes Grayce Sessions, NP  levothyroxine (SYNTHROID) 75 MCG tablet Take 1 tablet (75 mcg total) by mouth daily. 10/21/23 10/20/24 Yes Georganna Skeans, MD  fluconazole (DIFLUCAN) 150 MG tablet Take 1 po day 1, day 4  and day 7 Patient not taking: Reported on 12/14/2023 12/02/23   Georganna Skeans, MD  FLUoxetine (PROZAC) 20 MG capsule TAKE 1 CAPSULE BY MOUTH EVERY DAY Patient not taking: Reported on 11/24/2023 10/21/23   Georganna Skeans, MD  fluticasone (FLOVENT HFA) 110 MCG/ACT inhaler Inhale 2 puffs into the lungs 2 (two) times daily. Patient not taking: Reported on 11/24/2023 10/21/23   Georganna Skeans, MD  montelukast (SINGULAIR) 10 MG tablet Take 1 tablet (10 mg total) by mouth daily. Patient not taking: Reported on 11/24/2023 10/21/23   Georganna Skeans, MD  Multiple Vitamin (MULTIVITAMIN) tablet Take 1 tablet by mouth daily. Patient not taking: Reported on 11/24/2023    [provider]  nitrofurantoin, macrocrystal-monohydrate, (MACROBID) 100 MG capsule Take 1 capsule (100 mg total) by mouth 2 (two) times daily. Patient not taking: Reported on 11/24/2023 09/23/23   Bing Neighbors, NP  predniSONE (STERAPRED UNI-PAK 21 TAB) 10 MG (21) TBPK tablet As directed Patient not taking: Reported on 11/24/2023 09/16/23   Raspet, Noberto Retort, PA-C  Prenatal Vit-Fe Fumarate-FA (PRENATAL VITAMINS) 28-0.8 MG TABS Take one tablet daily by mouth. Patient not taking: Reported on 11/24/2023 09/26/22   Georges Mouse, NP  [facility] 80-4.5 MCG/ACT inhaler Inhale into the lungs. Patient not taking: Reported on 11/24/2023 06/19/23   [provider]    Family History Family History  Problem Relation Age of Onset   Hyperlipidemia Paternal Grandmother    Thyroid disease Paternal Grandmother  Social History Social History   Tobacco Use   Smoking status: Never    Passive exposure: Yes   Smokeless tobacco: Never  Vaping Use   Vaping status: Some Days   Substances: Nicotine, CBD, Flavoring  Substance Use Topics   Alcohol use: Yes    Comment: socially   Drug use: Not Currently    Types: Marijuana    Comment: daily     Allergies   Patient has no known allergies.   Review of Systems Review of Systems   Constitutional: Negative.   HENT:  Positive for congestion, postnasal drip and sore throat. Negative for dental problem, drooling, ear discharge, ear pain, facial swelling, hearing loss, mouth sores, nosebleeds, rhinorrhea, sinus pressure, sinus pain, sneezing, tinnitus, trouble swallowing and voice change.   Respiratory:  Positive for cough. Negative for apnea, choking, chest tightness, shortness of breath, wheezing and stridor.   Cardiovascular: Negative.   Gastrointestinal: Negative.   Skin: Negative.   Neurological: Negative.      Physical Exam Triage Vital Signs ED Triage Vitals  Encounter Vitals Group     BP 12/14/23 1303 111/76     Systolic BP Percentile --      Diastolic BP Percentile --      Pulse Rate 12/14/23 1303 79     Resp 12/14/23 1303 18     Temp 12/14/23 1303 (!) 97.4 F (36.3 C)     Temp Source 12/14/23 1303 Oral     SpO2 12/14/23 1303 97 %     Weight --      Height --      Head Circumference --      Peak Flow --      Pain Score 12/14/23 1301 6     Pain Loc --      Pain Education --      Exclude from Growth Chart --    No data found.  Updated Vital Signs BP 111/76 (BP Location: Left Arm)   Pulse 79   Temp (!) 97.4 F (36.3 C) (Oral)   Resp 18   LMP 12/14/2023   SpO2 97%   Visual Acuity Right Eye Distance:   Left Eye Distance:   Bilateral Distance:    Right Eye Near:   Left Eye Near:    Bilateral Near:     Physical Exam Constitutional:      Appearance: Normal appearance.  HENT:     Right Ear: Tympanic membrane, ear canal and external ear normal.     Left Ear: Tympanic membrane, ear canal and external ear normal.     Nose: Congestion present.     Mouth/Throat:     Pharynx: No oropharyngeal exudate or posterior oropharyngeal erythema.  Cardiovascular:     Rate and Rhythm: Normal rate and regular rhythm.     Pulses: Normal pulses.     Heart sounds: Normal heart sounds.  Pulmonary:     Effort: Pulmonary effort is normal.     Breath  sounds: Normal breath sounds.  Neurological:     Mental Status: She is alert and oriented to person, place, and time. Mental status is at baseline.      UC Treatments / Results  Labs (all labs ordered are listed, but only abnormal results are displayed) Labs Reviewed - No data to display  EKG   Radiology No results found.  Procedures Procedures (including critical care time)  Medications Ordered in UC Medications - No data to display  Initial Impression / Assessment and Plan /  UC Course  I have reviewed the triage vital signs and the nursing notes.  Pertinent labs & imaging results that were available during my care of the patient were reviewed by me and considered in my medical decision making (see chart for details).  Seasonal allergies, nasal congestion  Vitals are stable, patient is in no signs of distress nontoxic-appearing, denying fever, declined viral testing and strep testing, etiology is most likely related to seasonal weather change, prescribed daily antihistamine and ipratropium nasal spray, discussed supportive measures and advised follow-up as needed Final Clinical Impressions(s) / UC Diagnoses   Final diagnoses:  Nasal congestion     Discharge Instructions      Today you were evaluated for nasal congestion which is most likely related to seasonal weather change and pollen  Please start daily allergy medicine such as Claritin or Zyrtec to help minimize symptoms  You may use ipratropium nasal spray which has been prescribed for you to further help clear out congestion  Would recommend saline nasal spray for moisturize the nose as needed  The burning sensation from your nose is due to nasal dryness, the saline nasal spray will help with this  May coat nose twice daily as needed with petroleum jelly or similar product  May follow-up as needed     ED Prescriptions   None    PDMP not reviewed this encounter.   Reena Canning, NP 12/14/23  1325

## 2023-12-14 NOTE — ED Triage Notes (Signed)
 Nasal congestion and nose "burns" x 3 days. She took benadryl last night. Denies known fever

## 2023-12-21 ENCOUNTER — Encounter (INDEPENDENT_AMBULATORY_CARE_PROVIDER_SITE_OTHER): Payer: Self-pay

## 2024-01-16 ENCOUNTER — Other Ambulatory Visit: Payer: Self-pay | Admitting: Family Medicine

## 2024-02-01 ENCOUNTER — Ambulatory Visit
Admission: RE | Admit: 2024-02-01 | Discharge: 2024-02-01 | Disposition: A | Discharge status: Home / Self Care | Payer: MEDICAID | Source: Ambulatory Visit | Attending: Physician Assistant | Admitting: Physician Assistant

## 2024-02-01 VITALS — BP 112/74 | HR 76 | Temp 97.3°F | Resp 16

## 2024-02-01 DIAGNOSIS — Z113 Encounter for screening for infections with a predominantly sexual mode of transmission: Secondary | ICD-10-CM | POA: Insufficient documentation

## 2024-02-01 DIAGNOSIS — N3000 Acute cystitis without hematuria: Secondary | ICD-10-CM | POA: Diagnosis present

## 2024-02-01 LAB — POCT URINALYSIS DIP (MANUAL ENTRY)
Bilirubin, UA: NEGATIVE
Glucose, UA: NEGATIVE mg/dL
Ketones, POC UA: NEGATIVE mg/dL
Nitrite, UA: NEGATIVE
Protein Ur, POC: >=300 mg/dL — AB
Spec Grav, UA: >=1.03 — AB (ref 1.010–1.025)
Urobilinogen, UA: 0.2 U/dL
pH, UA: 7 (ref 5.0–8.0)

## 2024-02-01 MED ORDER — NITROFURANTOIN MONOHYD MACRO 100 MG PO CAPS
100.0000 mg | ORAL_CAPSULE | Freq: Two times a day (BID) | ORAL | 0 refills | Status: DC
Start: 2024-02-01 — End: 2024-03-02

## 2024-02-01 NOTE — ED Provider Notes (Signed)
 [name]    CSN: [id number] Arrival date & time: 02/01/24  1823      History   Chief Complaint Chief Complaint  Patient presents with   Vaginal Itching    Just wanna get tested - Entered by patient   Dysuria    HPI Emily Allen is a 18 y.o. female.   Patient here today for evaluation of dysuria that started 3 days ago. She reports some vaginal discomfort with urination but denies any vaginal itching (she states she told the nurse this because she wanted to be sure she had a vaginal swab ordered). She has not had any vaginal discharge. She has not had any nausea or vomiting but has had some lower abdominal discomfort and lower back pain.   The history is provided by the patient.  Vaginal Itching Associated symptoms include abdominal pain.  Dysuria Associated symptoms: abdominal pain and vaginal discharge   Associated symptoms: no fever, no nausea and no vomiting     Past Medical History:  Diagnosis Date   Anxiety    Phreesia 04/27/2020   Asthma    Depression    Phreesia 04/27/2020   Hyperlipidemia    Phreesia 04/27/2020   Hypothyroidism    Insulin resistance    Thyroid  disease    Phreesia 04/27/2020    Patient Active Problem List   Diagnosis Date Noted   Insect bite of left breast 06/06/2022   Nexplanon  in place 11/04/2018   Menorrhagia 08/10/2018   Acanthosis nigricans 12/01/2017   Insulin resistance 12/01/2017   Hypothyroidism, acquired, 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 No pertinent surgical history.  OB History   No obstetric history on file.      Home Medications    Prior to Admission medications   Medication Sig Start Date End Date Taking? Authorizing Provider  albuterol  (PROVENTIL ) (2.5 MG/3ML) 0.083% nebulizer solution SMARTSIG:1 Nebule Via Nebulizer Every 4 Hours PRN 04/14/23  Yes [provider]   nitrofurantoin , macrocrystal-monohydrate, (MACROBID ) 100 MG capsule Take 1 capsule (100 mg total) by mouth 2 (two) times daily. 02/01/24  Yes Vernestine Gondola, PA-C  cetirizine  (ZYRTEC  ALLERGY) 10 MG tablet Take 1 tablet (10 mg total) by mouth daily. Patient not taking: Reported on 02/01/2024 12/14/23   Reena Canning, NP  clotrimazole -betamethasone  (LOTRISONE ) cream Apply 1 Application topically daily. Patient not taking: Reported on 02/01/2024 11/24/23   Marius Siemens, NP  fluconazole  (DIFLUCAN ) 150 MG tablet Take 1 po day 1, day 4 and day 7 Patient not taking: Reported on 12/14/2023 12/02/23   Abraham Abo, MD  FLUoxetine  (PROZAC ) 20 MG capsule TAKE 1 CAPSULE BY MOUTH EVERY DAY Patient not taking: Reported on 02/01/2024 01/20/24   Abraham Abo, MD  fluticasone  (FLOVENT  HFA) 110 MCG/ACT inhaler Inhale 2 puffs into the lungs 2 (two) times daily. Patient not taking: Reported on 11/24/2023 10/21/23   Abraham Abo, MD  ipratropium (ATROVENT ) 0.03 % nasal spray Place 2 sprays into both nostrils every 12 (twelve) hours. Patient not taking: Reported on 02/01/2024 12/14/23   Reena Canning, NP  levothyroxine  (SYNTHROID ) 75 MCG tablet Take 1 tablet (75 mcg total) by mouth daily. Patient not taking: Reported on 02/01/2024 10/21/23 10/20/24  Abraham Abo, MD  montelukast  (SINGULAIR ) 10 MG tablet TAKE 1 TABLET BY MOUTH EVERY DAY Patient not taking: Reported on 02/01/2024 01/20/24   Abraham Abo, MD  Multiple Vitamin (MULTIVITAMIN)  tablet Take 1 tablet by mouth daily. Patient not taking: Reported on 11/24/2023    [provider]  predniSONE  (STERAPRED UNI-PAK 21 TAB) 10 MG (21) TBPK tablet As directed Patient not taking: Reported on 11/24/2023 09/16/23   Raspet, Betsey Brow, PA-C  Prenatal Vit-Fe Fumarate-FA (PRENATAL VITAMINS) 28-0.8 MG TABS Take one tablet daily by mouth. Patient not taking: Reported on 11/24/2023 09/26/22   Marijean Shouts, NP  SYMBICORT 80-4.5 MCG/ACT inhaler Inhale into the  lungs. Patient not taking: Reported on 11/24/2023 06/19/23   [provider]    Family History Family History  Problem Relation Age of Onset   Hyperlipidemia Paternal Grandmother    Thyroid  disease Paternal Grandmother     Social History Social History   Tobacco Use   Smoking status: Never    Passive exposure: Yes   Smokeless tobacco: Never  Vaping Use   Vaping status: Some Days   Substances: Nicotine, CBD, Flavoring  Substance Use Topics   Alcohol use: Yes    Comment: socially   Drug use: Not Currently    Types: Marijuana    Comment: daily     Allergies   Patient has no known allergies.   Review of Systems Review of Systems  Constitutional:  Negative for chills and fever.  Eyes:  Negative for discharge and redness.  Gastrointestinal:  Positive for abdominal pain. Negative for nausea and vomiting.  Genitourinary:  Positive for dysuria and vaginal discharge.  Musculoskeletal:  Positive for back pain.     Physical Exam Triage Vital Signs ED Triage Vitals  Encounter Vitals Group     BP      Systolic BP Percentile      Diastolic BP Percentile      Pulse      Resp      Temp      Temp src      SpO2      Weight      Height      Head Circumference      Peak Flow      Pain Score      Pain Loc      Pain Education      Exclude from Growth Chart    No data found.  Updated Vital Signs BP 112/74 (BP Location: Left Arm)   Pulse 76   Temp (!) 97.3 F (36.3 C) (Oral)   Resp 16   LMP 02/01/2024 (Exact Date)   SpO2 97%   Visual Acuity Right Eye Distance:   Left Eye Distance:   Bilateral Distance:    Right Eye Near:   Left Eye Near:    Bilateral Near:     Physical Exam Vitals and nursing note reviewed.  Constitutional:      General: She is not in acute distress.    Appearance: Normal appearance. She is not ill-appearing.  HENT:     Head: Normocephalic and atraumatic.  Eyes:     Conjunctiva/sclera: Conjunctivae normal.  Cardiovascular:      Rate and Rhythm: Normal rate.  Pulmonary:     Effort: Pulmonary effort is normal. No respiratory distress.  Neurological:     Mental Status: She is alert.  Psychiatric:        Mood and Affect: Mood normal.        Behavior: Behavior normal.        Thought Content: Thought content normal.      UC Treatments / Results  Labs (all labs ordered are listed, but  only abnormal results are displayed) Labs Reviewed  POCT URINALYSIS DIP (MANUAL ENTRY) - Abnormal; Notable for the following components:      Result Value   Color, UA other (*)    Clarity, UA cloudy (*)    Spec Grav, UA >=1.030 (*)    Blood, UA large (*)    Protein Ur, POC >=300 (*)    Leukocytes, UA Moderate (2+) (*)    All other components within normal limits  URINE CULTURE  CERVICOVAGINAL ANCILLARY ONLY    EKG   Radiology No results found.  Procedures Procedures (including critical care time)  Medications Ordered in UC Medications - No data to display  Initial Impression / Assessment and Plan / UC Course  I have reviewed the triage vital signs and the nursing notes.  Pertinent labs & imaging results that were available during my care of the patient were reviewed by me and considered in my medical decision making (see chart for details).    UTI suspected based on UA results. Urine culture ordered. Macrobid  prescribed. STD screening as well as BV and yeast. Encouraged follow up if symptoms persist or worsen.   Final Clinical Impressions(s) / UC Diagnoses   Final diagnoses:  Acute cystitis without hematuria  Screening for STD (sexually transmitted disease)   Discharge Instructions   None    ED Prescriptions     Medication Sig Dispense Auth. Provider   nitrofurantoin , macrocrystal-monohydrate, (MACROBID ) 100 MG capsule Take 1 capsule (100 mg total) by mouth 2 (two) times daily. 10 capsule Vernestine Gondola, PA-C      PDMP not reviewed this encounter.   Vernestine Gondola, PA-C 02/01/24 1930

## 2024-02-01 NOTE — ED Triage Notes (Signed)
 Pt reports vaginal itching and dysuria x3 days. Pt also reports bumps (raised red) to buttocks and inguinal areas x1 week. Concern for STIs - would like to complete swab, no bloodwork.

## 2024-02-02 ENCOUNTER — Ambulatory Visit (HOSPITAL_COMMUNITY): Payer: Self-pay

## 2024-02-02 LAB — CERVICOVAGINAL ANCILLARY ONLY
Bacterial Vaginitis (gardnerella): POSITIVE — AB
Candida Glabrata: NEGATIVE
Candida Vaginitis: POSITIVE — AB
Chlamydia: POSITIVE — AB
Comment: NEGATIVE
Comment: NEGATIVE
Comment: NEGATIVE
Comment: NEGATIVE
Comment: NEGATIVE
Comment: NORMAL
Neisseria Gonorrhea: POSITIVE — AB
Trichomonas: NEGATIVE

## 2024-02-03 ENCOUNTER — Encounter: Payer: Self-pay | Admitting: Emergency Medicine

## 2024-02-03 ENCOUNTER — Ambulatory Visit
Admission: EM | Admit: 2024-02-03 | Discharge: 2024-02-03 | Disposition: A | Discharge status: Home / Self Care | Payer: MEDICAID | Attending: Family Medicine | Admitting: Family Medicine

## 2024-02-03 DIAGNOSIS — A549 Gonococcal infection, unspecified: Secondary | ICD-10-CM

## 2024-02-03 DIAGNOSIS — A568 Sexually transmitted chlamydial infection of other sites: Secondary | ICD-10-CM | POA: Diagnosis not present

## 2024-02-03 LAB — URINE CULTURE: Culture: >=100000 — AB

## 2024-02-03 MED ORDER — FLUCONAZOLE 150 MG PO TABS: 150.0000 mg | ORAL_TABLET | Freq: Once | ORAL | 0 refills | Status: AC

## 2024-02-03 MED ORDER — DOXYCYCLINE HYCLATE 100 MG PO TABS: 100.0000 mg | ORAL_TABLET | Freq: Two times a day (BID) | ORAL | 0 refills | Status: AC

## 2024-02-03 MED ORDER — CEFTRIAXONE SODIUM 500 MG IJ SOLR
500.0000 mg | INTRAMUSCULAR | Status: DC
  Administered 2024-02-03: 500 mg via INTRAMUSCULAR

## 2024-02-03 MED ORDER — METRONIDAZOLE 0.75 % VA GEL: 1.0000 | Freq: Every day | VAGINAL | 0 refills | Status: AC

## 2024-02-03 NOTE — ED Triage Notes (Signed)
 Pt here for follow up rocephin injection. Has already picked up additional meds sent out by callback nurse.

## 2024-02-18 NOTE — Telephone Encounter (Signed)
 Copied from CRM [id number]. Topic: Clinical - Pink Word Triage >> Feb 18, 2024  4:13 PM Star East wrote: Reason for Triage: pain in both ears, level 6, both pop when yawning also-  [phone]

## 2024-03-02 ENCOUNTER — Ambulatory Visit
Admission: RE | Admit: 2024-03-02 | Discharge: 2024-03-02 | Disposition: A | Discharge status: Home / Self Care | Payer: MEDICAID | Source: Ambulatory Visit | Attending: Nurse Practitioner | Admitting: Nurse Practitioner

## 2024-03-02 VITALS — BP 116/87 | HR 102 | Temp 97.8°F | Resp 18

## 2024-03-02 DIAGNOSIS — J329 Chronic sinusitis, unspecified: Secondary | ICD-10-CM

## 2024-03-02 DIAGNOSIS — J029 Acute pharyngitis, unspecified: Secondary | ICD-10-CM

## 2024-03-02 DIAGNOSIS — B9789 Other viral agents as the cause of diseases classified elsewhere: Secondary | ICD-10-CM | POA: Diagnosis not present

## 2024-03-02 DIAGNOSIS — H9203 Otalgia, bilateral: Secondary | ICD-10-CM

## 2024-03-02 LAB — POCT RAPID STREP A (OFFICE): Rapid Strep A Screen: NEGATIVE

## 2024-03-02 MED ORDER — MUCINEX DM MAXIMUM STRENGTH 60-1200 MG PO TB12: 1.0000 | ORAL_TABLET | Freq: Two times a day (BID) | ORAL | 0 refills | Status: DC

## 2024-03-02 MED ORDER — CETIRIZINE-PSEUDOEPHEDRINE ER 5-120 MG PO TB12: 1.0000 | ORAL_TABLET | Freq: Every day | ORAL | 0 refills | Status: AC

## 2024-03-02 MED ORDER — FLUTICASONE PROPIONATE 50 MCG/ACT NA SUSP: 2.0000 | Freq: Every day | NASAL | 0 refills | Status: DC

## 2024-03-02 NOTE — Discharge Instructions (Addendum)
 You were evaluated today for symptoms consistent with an upper respiratory infection, including sore throat, ear discomfort, cough, runny nose, chills, low-grade fever, and nausea. A strep test was negative, and your throat appears irritated likely due to postnasal drainage. Your ears show no signs of infection. These symptoms are common with viral illnesses and typically improve within 7 to 10 days.  You were prescribed Zyrtec -D to take once daily in the morning to help reduce nasal congestion and drainage. You were also given Flonase  nasal spray to use each morning--be sure to aim the spray slightly outward toward the ear and avoid blowing your nose right after use. Mucinex DM was prescribed to be taken twice daily to help loosen mucus and ease your cough. Use a cool mist humidifier at night to keep your airways moist and reduce throat irritation. Drink plenty of fluids, rest, and consider warm teas or throat lozenges to soothe your throat.  Follow up with your primary care provider if your symptoms worsen, last longer than 10 days, or if you develop new symptoms such as ear pain, high fever, shortness of breath, chest pain, or facial swelling.   Go to the emergency room if you experience severe difficulty breathing, persistent vomiting, confusion, or any symptoms that feel sudden or severe.

## 2024-03-02 NOTE — ED Provider Notes (Signed)
 [name]    CSN: [id number] Arrival date & time: 03/02/24  1609      History   Chief Complaint Chief Complaint  Patient presents with   Ear Injury    I think I had a miscarriage also my ears hurt - Entered by patient   Otalgia   Sore Throat    HPI Emily Allen is a 18 y.o. female.   Discussed the use of AI scribe software for clinical note transcription with the patient, who gave verbal consent to proceed.   Emily Allen is a 18 y.o. female that presents with ear discomfort for one month and sore throat for two days. The patient reports experiencing chills and a fever of 100F this morning. Associated symptoms include a runny nose, cough, and nausea. The patient denies sneezing, trouble breathing, wheezing, headache, vomiting, or diarrhea. The ear discomfort has been persistent for a month, with no specific aggravating or alleviating factors mentioned. The sore throat began two days ago. The patient has not taken any medication for symptom relief. She denies being around anyone who has been sick recently. She reports the presence of black mold in her home which she states always has me sick. The patient is not currently taking any prescribed medications. She has not reported any recent healthcare interactions or lifestyle changes that could be influencing her current condition.  The following portions of the patient's history were reviewed and updated as appropriate: allergies, current medications, past family history, past medical history, past social history, past surgical history, and problem list.         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 No pertinent surgical history.  OB History   No obstetric history on file.      Home Medications    Prior to Admission medications   Medication Sig Start Date End Date Taking? Authorizing Provider  cetirizine -pseudoephedrine (ZYRTEC -D) 5-120 MG tablet Take 1 tablet by mouth daily with breakfast for 10 days. 03/02/24 03/12/24 Yes Arrington Bencomo, Lucie, FNP  Dextromethorphan-guaiFENesin (MUCINEX DM MAXIMUM STRENGTH) 60-1200 MG TB12 Take 1 tablet by mouth 2 (two) times daily. 03/02/24  Yes Iola Lucie, FNP  fluticasone  (FLONASE ) 50 MCG/ACT nasal spray Place 2 sprays into both nostrils daily. Shake well before use. Gently blow nose before spraying. Do not blow nose immediately after use. You should not taste the medication or feel it going down your throat; if you do, adjust your technique. 03/02/24  Yes Iola Lucie, FNP    Family History Family History  Problem Relation Age of Onset   Hyperlipidemia Paternal Grandmother    Thyroid  disease Paternal Grandmother     Social History Social History   Tobacco Use   Smoking status: Never    Passive exposure: Yes   Smokeless tobacco: Never  Vaping Use   Vaping status: Some Days  Substances: Nicotine, CBD, Flavoring  Substance Use Topics   Alcohol use: Yes    Comment: socially   Drug use: Not Currently    Types: Marijuana    Comment: daily     Allergies   Patient has no known allergies.   Review of Systems Review of Systems  Constitutional:  Positive for chills. Negative for fever (TMAX 100 this AM).  HENT:  Positive for ear pain, rhinorrhea and sore throat. Negative for congestion and sneezing.   Respiratory:  Positive for cough.  Negative for shortness of breath and wheezing.   Gastrointestinal:  Positive for nausea. Negative for diarrhea and vomiting.  Musculoskeletal:  Negative for myalgias.  Neurological:  Negative for headaches.  All other systems reviewed and are negative.    Physical Exam Triage Vital Signs ED Triage Vitals [03/02/24 1625]  Encounter Vitals Group     BP 116/87     Girls Systolic BP Percentile      Girls Diastolic BP Percentile      Boys Systolic BP Percentile      Boys Diastolic BP Percentile      Pulse Rate (!) 102     Resp 18     Temp 97.8 F (36.6 C)     Temp Source Oral     SpO2 96 %     Weight      Height      Head Circumference      Peak Flow      Pain Score 10     Pain Loc      Pain Education      Exclude from Growth Chart    No data found.  Updated Vital Signs BP 116/87 (BP Location: Left Arm)   Pulse (!) 102   Temp 97.8 F (36.6 C) (Oral)   Resp 18   LMP 02/01/2024 (Exact Date)   SpO2 96%   Visual Acuity Right Eye Distance:   Left Eye Distance:   Bilateral Distance:    Right Eye Near:   Left Eye Near:    Bilateral Near:     Physical Exam Vitals reviewed.  Constitutional:      General: She is awake. She is not in acute distress.    Appearance: Normal appearance. She is well-developed. She is not ill-appearing, toxic-appearing or diaphoretic.  HENT:     Head: Normocephalic.     Right Ear: Tympanic membrane, ear canal and external ear normal. No drainage, swelling or tenderness. No middle ear effusion. Tympanic membrane is not erythematous.     Left Ear: Tympanic membrane, ear canal and external ear normal. No drainage, swelling or tenderness.  No middle ear effusion. Tympanic membrane is not erythematous.     Nose: Congestion present. No rhinorrhea.     Mouth/Throat:     Lips: Pink.     Mouth: Mucous membranes are moist.     Pharynx: Posterior oropharyngeal erythema present. No pharyngeal swelling, oropharyngeal exudate or uvula swelling.      Tonsils: No tonsillar exudate or tonsillar abscesses.  Eyes:     General: Vision grossly intact.     Conjunctiva/sclera: Conjunctivae normal.  Cardiovascular:     Rate and Rhythm: Normal rate.     Heart sounds: Normal heart sounds.  Pulmonary:     Effort: Pulmonary effort is normal. No tachypnea or respiratory distress.     Breath sounds: Normal breath sounds and air entry.  Musculoskeletal:        General: Normal range of motion.  Cervical back: Normal range of motion and neck supple.  Lymphadenopathy:     Cervical: No cervical adenopathy.  Skin:    General: Skin is warm and dry.  Neurological:     General: No focal deficit present.     Mental Status: She is alert and oriented to person, place, and time.  Psychiatric:        Behavior: Behavior is cooperative.      UC Treatments / Results  Labs (all labs ordered are listed, but only abnormal results are displayed) Labs Reviewed  POCT RAPID STREP A (OFFICE) - Normal    EKG   Radiology No results found.  Procedures Procedures (including critical care time)  Medications Ordered in UC Medications - No data to display  Initial Impression / Assessment and Plan / UC Course  I have reviewed the triage vital signs and the nursing notes.  Pertinent labs & imaging results that were available during my care of the patient were reviewed by me and considered in my medical decision making (see chart for details).     The patient presents with a one-month history of intermittent ear discomfort and a two-day history of sore throat, along with chills, low-grade fever, runny nose, cough, and nausea. A rapid strep test was negative. Examination revealed an erythematous and irritated throat likely due to postnasal drainage, with clear nasal congestion and no signs of ear infection or inflammation. The presentation is consistent with a viral upper respiratory tract infection involving the ears, nose, and throat. The patient was  prescribed Zyrtec -D once daily in the morning, Flonase  nasal spray with education on proper use, and Mucinex DM twice daily to address cough and congestion. Supportive care measures, including use of a humidifier at night and maintaining hydration, were recommended. The patient was advised to follow up with their primary care provider if symptoms persist beyond 10 days, worsen, or if new symptoms such as high fever, ear pain, or shortness of breath develop. ED precautions were reviewed.  Today's evaluation has revealed no signs of a dangerous process. Discussed diagnosis with patient and/or guardian. Patient and/or guardian aware of their diagnosis, possible red flag symptoms to watch out for and need for close follow up. Patient and/or guardian understands verbal and written discharge instructions. Patient and/or guardian comfortable with plan and disposition.  Patient and/or guardian has a clear mental status at this time, good insight into illness (after discussion and teaching) and has clear judgment to make decisions regarding their care  Documentation was completed with the aid of voice recognition software. Transcription may contain typographical errors.   Final Clinical Impressions(s) / UC Diagnoses   Final diagnoses:  Acute otalgia, bilateral  Acute sore throat  Viral sinusitis     Discharge Instructions      You were evaluated today for symptoms consistent with an upper respiratory infection, including sore throat, ear discomfort, cough, runny nose, chills, low-grade fever, and nausea. A strep test was negative, and your throat appears irritated likely due to postnasal drainage. Your ears show no signs of infection. These symptoms are common with viral illnesses and typically improve within 7 to 10 days.  You were prescribed Zyrtec -D to take once daily in the morning to help reduce nasal congestion and drainage. You were also given Flonase  nasal spray to use each morning--be sure to  aim the spray slightly outward toward the ear and avoid blowing your nose right after use. Mucinex DM was prescribed to be taken twice daily to help  loosen mucus and ease your cough. Use a cool mist humidifier at night to keep your airways moist and reduce throat irritation. Drink plenty of fluids, rest, and consider warm teas or throat lozenges to soothe your throat.  Follow up with your primary care provider if your symptoms worsen, last longer than 10 days, or if you develop new symptoms such as ear pain, high fever, shortness of breath, chest pain, or facial swelling.   Go to the emergency room if you experience severe difficulty breathing, persistent vomiting, confusion, or any symptoms that feel sudden or severe.      ED Prescriptions     Medication Sig Dispense Auth. Provider   cetirizine -pseudoephedrine (ZYRTEC -D) 5-120 MG tablet Take 1 tablet by mouth daily with breakfast for 10 days. 10 tablet Iola Lukes, FNP   fluticasone  (FLONASE ) 50 MCG/ACT nasal spray Place 2 sprays into both nostrils daily. Shake well before use. Gently blow nose before spraying. Do not blow nose immediately after use. You should not taste the medication or feel it going down your throat; if you do, adjust your technique. 16 g Iola Lukes, FNP   Dextromethorphan-guaiFENesin (MUCINEX DM MAXIMUM STRENGTH) 60-1200 MG TB12 Take 1 tablet by mouth 2 (two) times daily. 20 tablet Iola Lukes, FNP      PDMP not reviewed this encounter.   Iola Lukes, [place] 03/02/24 1739

## 2024-03-02 NOTE — ED Triage Notes (Signed)
 Pt reports bilateral ear pain and sore throat x1 month (has been trying to get in with PCP). Ear pain started first followed by throat pain. It sounds like fluid moving around when I cough. No med use for symptoms.

## 2024-04-21 ENCOUNTER — Ambulatory Visit: Payer: Self-pay

## 2024-04-22 ENCOUNTER — Ambulatory Visit: Payer: Self-pay

## 2024-04-28 ENCOUNTER — Other Ambulatory Visit: Payer: Self-pay | Admitting: Family Medicine

## 2024-05-09 ENCOUNTER — Encounter: Payer: Self-pay | Admitting: Emergency Medicine

## 2024-05-09 ENCOUNTER — Inpatient Hospital Stay
Admission: RE | Admit: 2024-05-09 | Discharge: 2024-05-09 | Disposition: A | Discharge status: Home / Self Care | Payer: MEDICAID | Source: Ambulatory Visit

## 2024-05-09 ENCOUNTER — Ambulatory Visit
Admission: EM | Admit: 2024-05-09 | Discharge: 2024-05-09 | Disposition: A | Discharge status: Home / Self Care | Payer: MEDICAID | Attending: Physician Assistant | Admitting: Physician Assistant

## 2024-05-09 DIAGNOSIS — N3000 Acute cystitis without hematuria: Secondary | ICD-10-CM | POA: Diagnosis not present

## 2024-05-09 DIAGNOSIS — Z113 Encounter for screening for infections with a predominantly sexual mode of transmission: Secondary | ICD-10-CM

## 2024-05-09 LAB — POCT URINE DIPSTICK
Bilirubin, UA: NEGATIVE
Glucose, UA: NEGATIVE mg/dL
Ketones, POC UA: NEGATIVE mg/dL
Nitrite, UA: POSITIVE — AB
Spec Grav, UA: >=1.03 — AB (ref 1.010–1.025)
Urobilinogen, UA: 0.2 U/dL
pH, UA: 5.5 (ref 5.0–8.0)

## 2024-05-09 LAB — POCT URINE PREGNANCY: Preg Test, Ur: NEGATIVE

## 2024-05-09 MED ORDER — NITROFURANTOIN MONOHYD MACRO 100 MG PO CAPS: 100.0000 mg | ORAL_CAPSULE | Freq: Two times a day (BID) | ORAL | 0 refills | Status: DC

## 2024-05-09 NOTE — ED Triage Notes (Signed)
 Pt reports large blood clots since starting her period yesterday. This is abnormal for her. Pt notes had a positive pregnancy test on 8/27 and believes this may be a miscarriage. She is scheduled to follow up with a provider at [facility] tomorrow. Pt is mainly concerned about dysuria x1 month. Would like to complete cytology swab for STI screening. Only symptom is dysuria. No known exposures.

## 2024-05-10 ENCOUNTER — Ambulatory Visit (HOSPITAL_COMMUNITY): Payer: Self-pay

## 2024-05-10 LAB — CERVICOVAGINAL ANCILLARY ONLY
Bacterial Vaginitis (gardnerella): POSITIVE — AB
Candida Glabrata: NEGATIVE
Candida Vaginitis: NEGATIVE
Chlamydia: NEGATIVE
Comment: NEGATIVE
Comment: NEGATIVE
Comment: NEGATIVE
Comment: NEGATIVE
Comment: NEGATIVE
Comment: NORMAL
Neisseria Gonorrhea: NEGATIVE
Trichomonas: NEGATIVE

## 2024-05-10 MED ORDER — METRONIDAZOLE 500 MG PO TABS: 500.0000 mg | ORAL_TABLET | Freq: Two times a day (BID) | ORAL | 0 refills | Status: AC

## 2024-05-11 LAB — URINE CULTURE: Culture: >=100000 — AB

## 2024-05-15 NOTE — ED Provider Notes (Signed)
 [name]    CSN: [id number] Arrival date & time: 05/09/24  1614      History   Chief Complaint Chief Complaint  Patient presents with   Vaginal Bleeding   Exposure to STD   Dysuria    HPI Emily Allen is a 18 y.o. female.   Patient here today for evaluation of blood clots that started her period yesterday.  She reports this is abnormal for her.  She notes that she did have a positive pregnancy test at the end of last month and is not sure if she had a miscarriage.  She is scheduled for for follow-up with GYN tomorrow.  She reports she is mostly concerned because she has dysuria that she has had for a month.  She would also would like to have STD screening if possible.  She does have some dysuria but no known exposures.  The history is provided by the patient.  Vaginal Bleeding Associated symptoms: dysuria and vaginal discharge   Associated symptoms: no abdominal pain, no fever and no nausea   Exposure to STD Pertinent negatives include no abdominal pain and no shortness of breath.  Dysuria Associated symptoms: vaginal discharge   Associated symptoms: no abdominal pain, no fever, no nausea and no vomiting     Past Medical History:  Diagnosis Date   Anxiety    Phreesia 04/27/2020   Asthma    Depression    Phreesia 04/27/2020   Hyperlipidemia    Phreesia 04/27/2020   Hypothyroidism    Insulin resistance    Thyroid  disease    Phreesia 04/27/2020    Patient Active Problem List   Diagnosis Date Noted   Insect bite of left breast 06/06/2022   Nexplanon  in place 11/04/2018   Menorrhagia 08/10/2018   Acanthosis nigricans 12/01/2017   Insulin resistance 12/01/2017   Hypothyroidism, acquired, 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 No pertinent surgical history.  OB History   No obstetric history on file.      Home Medications    Prior to  Admission medications   Medication Sig Start Date End Date Taking? Authorizing Provider  nitrofurantoin , macrocrystal-monohydrate, (MACROBID ) 100 MG capsule Take 1 capsule (100 mg total) by mouth 2 (two) times daily. 05/09/24  Yes Billy Asberry FALCON, PA-C  Dextromethorphan-guaiFENesin  (MUCINEX  DM MAXIMUM STRENGTH) 60-1200 MG TB12 Take 1 tablet by mouth 2 (two) times daily. Patient not taking: Reported on 05/09/2024 03/02/24   Iola Lukes, FNP  fluticasone  (FLONASE ) 50 MCG/ACT nasal spray Place 2 sprays into both nostrils daily. Shake well before use. Gently blow nose before spraying. Do not blow nose immediately after use. You should not taste the medication or feel it going down your throat; if you do, adjust your technique. Patient not taking: Reported on 05/09/2024 03/02/24   Murrill, Samantha, FNP  metroNIDAZOLE  (FLAGYL ) 500 MG tablet Take 1 tablet (500 mg total) by mouth 2 (two) times daily for 7 days. 05/10/24 05/17/24  Vonna Sharlet POUR, MD    Family History Family History  Problem Relation Age of Onset   Hyperlipidemia Paternal Grandmother    Thyroid  disease Paternal Grandmother     Social History Social History   Tobacco Use   Smoking status: Never    Passive exposure: Yes   Smokeless tobacco: Never  Vaping Use   Vaping status: Some Days   Substances: Nicotine, CBD, Flavoring  Substance Use Topics  Alcohol use: Yes    Comment: socially   Drug use: Not Currently    Types: Marijuana    Comment: daily     Allergies   Patient has no known allergies.   Review of Systems Review of Systems  Constitutional:  Negative for chills and fever.  Eyes:  Negative for discharge and redness.  Respiratory:  Negative for shortness of breath.   Gastrointestinal:  Negative for abdominal pain, nausea and vomiting.  Genitourinary:  Positive for dysuria, vaginal bleeding and vaginal discharge. Negative for genital sores.     Physical Exam Triage Vital Signs ED Triage Vitals [05/09/24  1635]  Encounter Vitals Group     BP 137/81     Girls Systolic BP Percentile      Girls Diastolic BP Percentile      Boys Systolic BP Percentile      Boys Diastolic BP Percentile      Pulse Rate 85     Resp 16     Temp 97.8 F (36.6 C)     Temp Source Oral     SpO2 97 %     Weight      Height      Head Circumference      Peak Flow      Pain Score 4     Pain Loc      Pain Education      Exclude from Growth Chart    No data found.  Updated Vital Signs BP 137/81 (BP Location: Right Arm)   Pulse 85   Temp 97.8 F (36.6 C) (Oral)   Resp 16   LMP 05/07/2024 (Exact Date)   SpO2 97%   Visual Acuity Right Eye Distance:   Left Eye Distance:   Bilateral Distance:    Right Eye Near:   Left Eye Near:    Bilateral Near:     Physical Exam Vitals and nursing note reviewed.  Constitutional:      General: She is not in acute distress.    Appearance: Normal appearance. She is not ill-appearing.  HENT:     Head: Normocephalic and atraumatic.  Eyes:     Conjunctiva/sclera: Conjunctivae normal.  Cardiovascular:     Rate and Rhythm: Normal rate.  Pulmonary:     Effort: Pulmonary effort is normal. No respiratory distress.  Neurological:     Mental Status: She is alert.  Psychiatric:        Mood and Affect: Mood normal.        Behavior: Behavior normal.        Thought Content: Thought content normal.      UC Treatments / Results  Labs (all labs ordered are listed, but only abnormal results are displayed) Labs Reviewed  URINE CULTURE - Abnormal; Notable for the following components:      Result Value   Culture >=100,000 COLONIES/mL ESCHERICHIA COLI (*)    Organism ID, Bacteria ESCHERICHIA COLI (*)    All other components within normal limits  POCT URINE DIPSTICK - Abnormal; Notable for the following components:   Color, UA red (*)    Clarity, UA cloudy (*)    Spec Grav, UA >=1.030 (*)    Blood, UA large (*)    Nitrite, UA Positive (*)    Leukocytes, UA Trace (*)     All other components within normal limits  CERVICOVAGINAL ANCILLARY ONLY - Abnormal; Notable for the following components:   Bacterial Vaginitis (gardnerella) Positive (*)    All other components  within normal limits  POCT URINE PREGNANCY - Normal    EKG   Radiology No results found.  Procedures Procedures (including critical care time)  Medications Ordered in UC Medications - No data to display  Initial Impression / Assessment and Plan / UC Course  I have reviewed the triage vital signs and the nursing notes.  Pertinent labs & imaging results that were available during my care of the patient were reviewed by me and considered in my medical decision making (see chart for details).   UA consistent with UTI.  Will treat with Macrobid  and urine culture ordered.  STD screening also ordered.  Pregnancy screening negative and assured patient of same.  Advise she keep follow-up with GYN tomorrow.   Final Clinical Impressions(s) / UC Diagnoses   Final diagnoses:  Acute cystitis without hematuria  Screening examination for STD (sexually transmitted disease)   Discharge Instructions   None    ED Prescriptions     Medication Sig Dispense Auth. Provider   nitrofurantoin , macrocrystal-monohydrate, (MACROBID ) 100 MG capsule Take 1 capsule (100 mg total) by mouth 2 (two) times daily. 10 capsule Billy Asberry FALCON, PA-C      PDMP not reviewed this encounter.   Billy Asberry FALCON, PA-C 05/15/24 1256

## 2024-05-25 ENCOUNTER — Ambulatory Visit (INDEPENDENT_AMBULATORY_CARE_PROVIDER_SITE_OTHER): Payer: MEDICAID | Admitting: Family Medicine

## 2024-05-25 VITALS — BP 112/74 | HR 63 | Ht 63.5 in | Wt 236.6 lb

## 2024-05-25 DIAGNOSIS — Z7689 Persons encountering health services in other specified circumstances: Secondary | ICD-10-CM

## 2024-05-25 DIAGNOSIS — Z1159 Encounter for screening for other viral diseases: Secondary | ICD-10-CM

## 2024-05-25 DIAGNOSIS — Z1329 Encounter for screening for other suspected endocrine disorder: Secondary | ICD-10-CM

## 2024-05-25 DIAGNOSIS — Z130 Encounter for screening for diseases of the blood and blood-forming organs and certain disorders involving the immune mechanism: Secondary | ICD-10-CM

## 2024-05-25 DIAGNOSIS — Z0000 Encounter for general adult medical examination without abnormal findings: Secondary | ICD-10-CM | POA: Diagnosis not present

## 2024-05-25 DIAGNOSIS — Z13228 Encounter for screening for other metabolic disorders: Secondary | ICD-10-CM

## 2024-05-25 DIAGNOSIS — Z136 Encounter for screening for cardiovascular disorders: Secondary | ICD-10-CM | POA: Diagnosis not present

## 2024-05-25 NOTE — Progress Notes (Unsigned)
 New Patient Office Visit  Subjective    Patient ID: Emily Allen, female    DOB: 22-Dec-2005  Age: 18 y.o. MRN: [id number]  CC:  Chief Complaint  Patient presents with   Medical Management of Chronic Issues    Follow up on thyroid  issue  Pt would like referral for therapy     HPI Emily Allen presents to establish care ***  Outpatient Encounter Medications as of 05/25/2024  Medication Sig   Dextromethorphan-guaiFENesin  (MUCINEX  DM MAXIMUM STRENGTH) 60-1200 MG TB12 Take 1 tablet by mouth 2 (two) times daily. (Patient not taking: Reported on 05/09/2024)   fluticasone  (FLONASE ) 50 MCG/ACT nasal spray Place 2 sprays into both nostrils daily. Shake well before use. Gently blow nose before spraying. Do not blow nose immediately after use. You should not taste the medication or feel it going down your throat; if you do, adjust your technique. (Patient not taking: Reported on 05/09/2024)   nitrofurantoin , macrocrystal-monohydrate, (MACROBID ) 100 MG capsule Take 1 capsule (100 mg total) by mouth 2 (two) times daily.   No facility-administered encounter medications on file as of 05/25/2024.    Past Medical History:  Diagnosis Date   Anxiety    Phreesia 04/27/2020   Asthma    Depression    Phreesia 04/27/2020   Hyperlipidemia    Phreesia 04/27/2020   Hypothyroidism    Insulin resistance    Thyroid  disease    Phreesia 04/27/2020    No past surgical history on file.  Family History  Problem Relation Age of Onset   Hyperlipidemia Paternal Grandmother    Thyroid  disease Paternal Grandmother     Social History   Socioeconomic History   Marital status: Single    Spouse name: Not on file   Number of children: Not on file   Years of education: Not on file   Highest education level: Not on file  Occupational History   Not on file  Tobacco Use   Smoking status: Never    Passive exposure: Yes   Smokeless tobacco: Never  Vaping Use   Vaping status: Some Days    Substances: Nicotine, CBD, Flavoring  Substance and Sexual Activity   Alcohol use: Yes    Comment: socially   Drug use: Not Currently    Types: Marijuana    Comment: daily   Sexual activity: Yes    Birth control/protection: None    Comment: Last encounter: 09-21-2023  Other Topics Concern   Not on file  Social History Narrative   [name] 9th grade. In class and virtual.      Lives with Priscilla and brother.    Social Drivers of Corporate investment banker Strain: Low Risk  (10/21/2023)   Overall Financial Resource Strain (CARDIA)    Difficulty of Paying Living Expenses: Not hard at all  Food Insecurity: No Food Insecurity (10/21/2023)   Hunger Vital Sign    Worried About Running Out of Food in the Last Year: Never true    Ran Out of Food in the Last Year: Never true  Transportation Needs: No Transportation Needs (10/21/2023)   PRAPARE - Administrator, Civil Service (Medical): No    Lack of Transportation (Non-Medical): No  Physical Activity: Sufficiently Active (10/21/2023)   Exercise Vital Sign    Days of Exercise per Week: 5 days    Minutes of Exercise per Session: 30 min  Stress: No Stress Concern Present (10/21/2023)   [employer] of Occupational Health - Occupational Stress Questionnaire  Feeling of Stress : Not at all  Social Connections: Moderately Isolated (10/21/2023)   Social Connection and Isolation Panel    Frequency of Communication with Friends and Family: More than three times a week    Frequency of Social Gatherings with Friends and Family: More than three times a week    Attends Religious Services: Never    Database administrator or Organizations: No    Attends Banker Meetings: Never    Marital Status: Living with partner  Intimate Partner Violence: Not At Risk (10/21/2023)   Humiliation, Afraid, Rape, and Kick questionnaire    Fear of Current or Ex-Partner: No    Emotionally Abused: No    Physically Abused: No    Sexually  Abused: No    ROS      Objective   BP 112/74   Pulse 63   Ht 5' 3.5 (1.613 m)   Wt 236 lb 9.6 oz (107.3 kg)   LMP 05/07/2024 (Exact Date)   SpO2 97%   BMI 41.25 kg/m   Physical Exam  {Labs (Optional):23779}    Assessment & Plan:   There are no diagnoses linked to this encounter.   No follow-ups on file.   Tanda Raguel SQUIBB, MD

## 2024-05-26 ENCOUNTER — Encounter: Payer: Self-pay | Admitting: Family Medicine

## 2024-05-26 ENCOUNTER — Ambulatory Visit: Payer: Self-pay | Admitting: Family Medicine

## 2024-05-26 LAB — COMPREHENSIVE METABOLIC PANEL WITH GFR
ALT: 25 IU/L (ref 0–32)
AST: 16 IU/L (ref 0–40)
Albumin: 4.6 g/dL (ref 4.0–5.0)
Alkaline Phosphatase: 74 IU/L (ref 42–106)
BUN/Creatinine Ratio: 9 (ref 9–23)
BUN: 7 mg/dL (ref 6–20)
Bilirubin Total: 0.2 mg/dL (ref 0.0–1.2)
CO2: 21 mmol/L (ref 20–29)
Calcium: 9.6 mg/dL (ref 8.7–10.2)
Chloride: 104 mmol/L (ref 96–106)
Creatinine, Ser: 0.74 mg/dL (ref 0.57–1.00)
Globulin, Total: 2.6 g/dL (ref 1.5–4.5)
Glucose: 77 mg/dL (ref 70–99)
Potassium: 4.6 mmol/L (ref 3.5–5.2)
Sodium: 141 mmol/L (ref 134–144)
Total Protein: 7.2 g/dL (ref 6.0–8.5)
eGFR: 119 mL/min/1.73 (ref >59)

## 2024-05-26 LAB — CBC WITH DIFFERENTIAL/PLATELET
Basophils Absolute: 0 x10E3/uL (ref 0.0–0.2)
Basos: 0 %
EOS (ABSOLUTE): 0.1 x10E3/uL (ref 0.0–0.4)
Eos: 1 %
Hematocrit: 45.7 % (ref 34.0–46.6)
Hemoglobin: 14.3 g/dL (ref 11.1–15.9)
Immature Grans (Abs): 0.1 x10E3/uL (ref 0.0–0.1)
Immature Granulocytes: 1 %
Lymphocytes Absolute: 2.4 x10E3/uL (ref 0.7–3.1)
Lymphs: 33 %
MCH: 27.7 pg (ref 26.6–33.0)
MCHC: 31.3 g/dL — ABNORMAL LOW (ref 31.5–35.7)
MCV: 88 fL (ref 79–97)
Monocytes Absolute: 0.6 x10E3/uL (ref 0.1–0.9)
Monocytes: 8 %
Neutrophils Absolute: 4.1 x10E3/uL (ref 1.4–7.0)
Neutrophils: 57 %
Platelets: 307 x10E3/uL (ref 150–450)
RBC: 5.17 x10E6/uL (ref 3.77–5.28)
RDW: 12.5 % (ref 11.7–15.4)
WBC: 7.3 x10E3/uL (ref 3.4–10.8)

## 2024-05-26 LAB — LIPID PANEL
Chol/HDL Ratio: 3.9 ratio (ref 0.0–4.4)
Cholesterol, Total: 164 mg/dL (ref 100–199)
HDL: 42 mg/dL (ref >39)
LDL Chol Calc (NIH): 103 mg/dL — ABNORMAL HIGH (ref 0–99)
Triglycerides: 104 mg/dL (ref 0–149)
VLDL Cholesterol Cal: 19 mg/dL (ref 5–40)

## 2024-05-26 LAB — HEMOGLOBIN A1C
Est. average glucose Bld gHb Est-mCnc: 100 mg/dL
Hgb A1c MFr Bld: 5.1 % (ref 4.8–5.6)

## 2024-05-26 LAB — TSH+FREE T4
Free T4: 1.12 ng/dL (ref 0.82–1.77)
TSH: 3.04 u[IU]/mL (ref 0.450–4.500)

## 2024-05-26 LAB — HEPATITIS C ANTIBODY: Hep C Virus Ab: NONREACTIVE

## 2024-06-22 ENCOUNTER — Emergency Department (HOSPITAL_COMMUNITY)
Admission: EM | Admit: 2024-06-22 | Discharge: 2024-06-22 | Disposition: A | Discharge status: Home / Self Care | Payer: MEDICAID | Attending: Emergency Medicine | Admitting: Emergency Medicine

## 2024-06-22 ENCOUNTER — Encounter (HOSPITAL_COMMUNITY): Payer: Self-pay | Admitting: Emergency Medicine

## 2024-06-22 ENCOUNTER — Other Ambulatory Visit: Payer: Self-pay

## 2024-06-22 ENCOUNTER — Emergency Department (HOSPITAL_COMMUNITY): Payer: MEDICAID

## 2024-06-22 DIAGNOSIS — J45909 Unspecified asthma, uncomplicated: Secondary | ICD-10-CM | POA: Insufficient documentation

## 2024-06-22 DIAGNOSIS — E039 Hypothyroidism, unspecified: Secondary | ICD-10-CM | POA: Diagnosis not present

## 2024-06-22 DIAGNOSIS — R0789 Other chest pain: Secondary | ICD-10-CM | POA: Insufficient documentation

## 2024-06-22 DIAGNOSIS — K529 Noninfective gastroenteritis and colitis, unspecified: Secondary | ICD-10-CM | POA: Diagnosis not present

## 2024-06-22 DIAGNOSIS — R1110 Vomiting, unspecified: Secondary | ICD-10-CM | POA: Diagnosis present

## 2024-06-22 DIAGNOSIS — Z7951 Long term (current) use of inhaled steroids: Secondary | ICD-10-CM | POA: Insufficient documentation

## 2024-06-22 LAB — CBC WITH DIFFERENTIAL/PLATELET
Abs Immature Granulocytes: 0.05 K/uL (ref 0.00–0.07)
Basophils Absolute: 0 K/uL (ref 0.0–0.1)
Basophils Relative: 0 %
Eosinophils Absolute: 0.2 K/uL (ref 0.0–0.5)
Eosinophils Relative: 2 %
HCT: 41 % (ref 36.0–46.0)
Hemoglobin: 13.4 g/dL (ref 12.0–15.0)
Immature Granulocytes: 1 %
Lymphocytes Relative: 19 %
Lymphs Abs: 1.9 K/uL (ref 0.7–4.0)
MCH: 27.9 pg (ref 26.0–34.0)
MCHC: 32.7 g/dL (ref 30.0–36.0)
MCV: 85.2 fL (ref 80.0–100.0)
Monocytes Absolute: 0.5 K/uL (ref 0.1–1.0)
Monocytes Relative: 5 %
Neutro Abs: 7.4 K/uL (ref 1.7–7.7)
Neutrophils Relative %: 73 %
Platelets: 274 K/uL (ref 150–400)
RBC: 4.81 MIL/uL (ref 3.87–5.11)
RDW: 12.6 % (ref 11.5–15.5)
WBC: 10 K/uL (ref 4.0–10.5)
nRBC: 0 % (ref 0.0–0.2)

## 2024-06-22 LAB — COMPREHENSIVE METABOLIC PANEL WITH GFR
ALT: 25 U/L (ref 0–44)
AST: 19 U/L (ref 15–41)
Albumin: 3.7 g/dL (ref 3.5–5.0)
Alkaline Phosphatase: 56 U/L (ref 38–126)
Anion gap: 9 (ref 5–15)
BUN: 11 mg/dL (ref 6–20)
CO2: 19 mmol/L — ABNORMAL LOW (ref 22–32)
Calcium: 8.2 mg/dL — ABNORMAL LOW (ref 8.9–10.3)
Chloride: 108 mmol/L (ref 98–111)
Creatinine, Ser: 0.62 mg/dL (ref 0.44–1.00)
GFR, Estimated: >60 mL/min (ref >60)
Glucose, Bld: 97 mg/dL (ref 70–99)
Potassium: 4 mmol/L (ref 3.5–5.1)
Sodium: 136 mmol/L (ref 135–145)
Total Bilirubin: 0.3 mg/dL (ref 0.0–1.2)
Total Protein: 6.5 g/dL (ref 6.5–8.1)

## 2024-06-22 LAB — I-STAT CHEM 8, ED
BUN: 12 mg/dL (ref 6–20)
Calcium, Ion: 1.11 mmol/L — ABNORMAL LOW (ref 1.15–1.40)
Chloride: 107 mmol/L (ref 98–111)
Creatinine, Ser: 0.7 mg/dL (ref 0.44–1.00)
Glucose, Bld: 98 mg/dL (ref 70–99)
HCT: 40 % (ref 36.0–46.0)
Hemoglobin: 13.6 g/dL (ref 12.0–15.0)
Potassium: 3.9 mmol/L (ref 3.5–5.1)
Sodium: 139 mmol/L (ref 135–145)
TCO2: 21 mmol/L — ABNORMAL LOW (ref 22–32)

## 2024-06-22 LAB — URINALYSIS, ROUTINE W REFLEX MICROSCOPIC
Bilirubin Urine: NEGATIVE
Glucose, UA: NEGATIVE mg/dL
Hgb urine dipstick: NEGATIVE
Ketones, ur: NEGATIVE mg/dL
Leukocytes,Ua: NEGATIVE
Nitrite: NEGATIVE
Protein, ur: NEGATIVE mg/dL
Specific Gravity, Urine: 1.025 (ref 1.005–1.030)
pH: 6 (ref 5.0–8.0)

## 2024-06-22 LAB — LIPASE, BLOOD: Lipase: 19 U/L (ref 11–51)

## 2024-06-22 LAB — HCG, SERUM, QUALITATIVE: Preg, Serum: NEGATIVE

## 2024-06-22 LAB — TROPONIN I (HIGH SENSITIVITY)
Troponin I (High Sensitivity): <2 ng/L (ref <18)
Troponin I (High Sensitivity): <2 ng/L (ref <18)

## 2024-06-22 MED ORDER — ALUM & MAG HYDROXIDE-SIMETH 200-200-20 MG/5ML PO SUSP
30.0000 mL | Freq: Once | ORAL | Status: AC
  Administered 2024-06-22: 30 mL via ORAL
  Filled 2024-06-22: qty 30

## 2024-06-22 MED ORDER — LIDOCAINE VISCOUS HCL 2 % MT SOLN
15.0000 mL | Freq: Once | OROMUCOSAL | Status: AC
  Administered 2024-06-22: 15 mL via ORAL
  Filled 2024-06-22: qty 15

## 2024-06-22 MED ORDER — ONDANSETRON 4 MG PO TBDP: 4.0000 mg | ORAL_TABLET | Freq: Three times a day (TID) | ORAL | 0 refills | Status: DC | PRN

## 2024-06-22 MED ORDER — ONDANSETRON HCL 4 MG/2ML IJ SOLN
4.0000 mg | Freq: Once | INTRAMUSCULAR | Status: AC
  Administered 2024-06-22: 4 mg via INTRAVENOUS
  Filled 2024-06-22: qty 2

## 2024-06-22 NOTE — ED Triage Notes (Signed)
 Pt BIB [facility] from home due to chest pain that started this morning in the center of her chest and wrapped around her chest.  Pt has been having emesis intermittently for the past 3 days.  Pt did take a pregnancy test one month ago and it was positive.  Pt has not followed up with OBGYN at this time.  [facility] did hold aspiring and nitroglycerin due to pregnancy.  20g right AC. VS BP 118/80, HR 74, SpO2 100% RA

## 2024-06-22 NOTE — ED Provider Notes (Signed)
 [name] [facility] AT [facility] Provider Note   CSN: [id number] Arrival date & time: 06/22/24  9140     Patient presents with: Chest Pain and Emesis   Emily Allen is a 18 y.o. female.  Patient with past history significant for hypothyroidism, hyperlipidemia, asthma, anxiety presents to the emergency department concerns of vomiting and central chest pain.  States that the symptoms began this morning and feelings of chest pain at around her upper abdomen towards her back.  She reports having some vomiting intermittently for the last 3 days but chest pain developed today.  She states that she had a positive home pregnancy test earlier this month.  She is not currently following with OB/GYN due to early nature of her pregnancy.   Chest Pain Associated symptoms: vomiting   Emesis      Prior to Admission medications   Medication Sig Start Date End Date Taking? Authorizing Provider  ondansetron  (ZOFRAN -ODT) 4 MG disintegrating tablet Take 1 tablet (4 mg total) by mouth every 8 (eight) hours as needed for nausea or vomiting. 06/22/24  Yes Dereka Lueras A, PA-C  Dextromethorphan-guaiFENesin  (MUCINEX  DM MAXIMUM STRENGTH) 60-1200 MG TB12 Take 1 tablet by mouth 2 (two) times daily. Patient not taking: Reported on 05/09/2024 03/02/24   Iola Lukes, FNP  fluticasone  (FLONASE ) 50 MCG/ACT nasal spray Place 2 sprays into both nostrils daily. Shake well before use. Gently blow nose before spraying. Do not blow nose immediately after use. You should not taste the medication or feel it going down your throat; if you do, adjust your technique. Patient not taking: Reported on 05/09/2024 03/02/24   Iola Lukes, FNP  nitrofurantoin , macrocrystal-monohydrate, (MACROBID ) 100 MG capsule Take 1 capsule (100 mg total) by mouth 2 (two) times daily. 05/09/24   Billy Asberry FALCON, PA-C    Allergies: Patient has no known allergies.    Review of Systems  Cardiovascular:  Positive for  chest pain.  Gastrointestinal:  Positive for vomiting.  All other systems reviewed and are negative.   Updated Vital Signs BP 112/76   Pulse (!) 59   Temp 98 F (36.7 C) (Oral)   Resp 16   Ht 5' 3 (1.6 m)   Wt 106.6 kg   SpO2 100%   BMI 41.63 kg/m   Physical Exam Vitals and nursing note reviewed.  Constitutional:      General: She is not in acute distress.    Appearance: She is well-developed.  HENT:     Head: Normocephalic and atraumatic.  Eyes:     Conjunctiva/sclera: Conjunctivae normal.  Cardiovascular:     Rate and Rhythm: Normal rate and regular rhythm.     Heart sounds: No murmur heard. Pulmonary:     Effort: Pulmonary effort is normal. No respiratory distress.     Breath sounds: Normal breath sounds. No decreased breath sounds or wheezing.  Abdominal:     Palpations: Abdomen is soft.     Tenderness: There is abdominal tenderness in the epigastric area and left upper quadrant.     Comments: TTP along the epigastrium and LUQ  Musculoskeletal:        General: No swelling.     Cervical back: Neck supple.  Skin:    General: Skin is warm and dry.     Capillary Refill: Capillary refill takes less than 2 seconds.  Neurological:     Mental Status: She is alert.  Psychiatric:        Mood and Affect: Mood normal.     (  all labs ordered are listed, but only abnormal results are displayed) Labs Reviewed  COMPREHENSIVE METABOLIC PANEL WITH GFR - Abnormal; Notable for the following components:      Result Value   CO2 19 (*)    Calcium 8.2 (*)    All other components within normal limits  I-STAT CHEM 8, ED - Abnormal; Notable for the following components:   Calcium, Ion 1.11 (*)    TCO2 21 (*)    All other components within normal limits  CBC WITH DIFFERENTIAL/PLATELET  LIPASE, BLOOD  HCG, SERUM, QUALITATIVE  URINALYSIS, ROUTINE W REFLEX MICROSCOPIC  TROPONIN I (HIGH SENSITIVITY)  TROPONIN I (HIGH SENSITIVITY)    EKG: None  Radiology: DG Chest Portable  1 View Result Date: 06/22/2024 CLINICAL DATA:  Central chest pain beginning this morning with intermittent emesis 3 days. EXAM: PORTABLE CHEST 1 VIEW COMPARISON:  09/15/2023 FINDINGS: Lungs are somewhat hypoinflated and otherwise clear. Cardiomediastinal silhouette and remainder of the exam is unchanged. IMPRESSION: Hypoinflation without acute cardiopulmonary disease. Electronically Signed   By: Toribio Agreste M.D.   On: 06/22/2024 09:30    Procedures   Medications Ordered in the ED  ondansetron  (ZOFRAN ) injection 4 mg (4 mg Intravenous Given 06/22/24 0940)  alum & mag hydroxide-simeth (MAALOX/MYLANTA) 200-200-20 MG/5ML suspension 30 mL (30 mLs Oral Given 06/22/24 0940)    And  lidocaine (XYLOCAINE) 2 % viscous mouth solution 15 mL (15 mLs Oral Given 06/22/24 0940)                                    Medical Decision Making Amount and/or Complexity of Data Reviewed Labs: ordered. Radiology: ordered.  Risk OTC drugs. Prescription drug management.   This patient presents to the ED for concern of chest pain, vomiting, this involves an extensive number of treatment options, and is a complaint that carries with it a high risk of complications and morbidity.  The differential diagnosis includes ACS, GERD, gastritis   Co morbidities that complicate the patient evaluation  HLD, hypothyroidism   Lab Tests:  I Ordered, and personally interpreted labs.  The pertinent results include:  CBC unremarkable,    Imaging Studies ordered:  I ordered imaging studies including chest xray  I independently visualized and interpreted imaging which showed hypoinflation without acute cardiopulmonary disease. I agree with the radiologist interpretation   Cardiac Monitoring: / EKG:  The patient was maintained on a cardiac monitor.  I personally viewed and interpreted the cardiac monitored which showed an underlying rhythm of: sinus arrhythmia   Consultations Obtained:  I requested consultation  with none,  and discussed lab and imaging findings as well as pertinent plan - they recommend: N/A   Problem List / ED Course / Critical interventions / Medication management  Patient presents to the emergency department today with concerns of chest pain and vomiting.  Reports that she has been having vomiting off and on for the last 3 days with some centralized chest pain developed today.  Denies hematemesis.  No reported abdominal discomfort beyond the epigastrium and left upper quadrant.  Endorses some diarrhea but denies any sick contacts. No abnormal heart or lung sounds.  Tenderness to palpation in the epigastrium and left upper quadrant.  Normal bowel sounds.  Otherwise well-appearing with unremarkable vitals.  Given patient's report of for semester pregnancy, hold off on any medications at this time as she denies significant pain.  Zofran  and GI cocktail  administered for attempted symptomatic relief.  Will reassess shortly. Lab workup unremarkable with no acute findings seen.  No evidence signs of dehydration.  Chest x-ray negative. On reassessment, patient reports significant proved after Zofran  and GI cocktail.  Suspect likely GI source of symptoms including gastroenteritis.  Will discharge home with a prescription for Zofran  for continued nausea management.  Advised patient to follow-up with PCP for further assessment or return to the emergency department for any concerns of new or worsening symptoms.  Discharged home in stable condition. I ordered medication including Zofran , Maalox, for nausea, GI cocktail Reevaluation of the patient after these medicines showed that the patient improved I have reviewed the patients home medicines and have made adjustments as needed   Social Determinants of Health:  None   Test / Admission - Considered:  Considered but stable for outpatient follow-up.  Final diagnoses:  Gastroenteritis  Other chest pain    ED Discharge Orders           Ordered    ondansetron  (ZOFRAN -ODT) 4 MG disintegrating tablet  Every 8 hours PRN        06/22/24 1406               Nashay Brickley A, PA-C 06/22/24 1452    Horton, Kristie M, DO 06/24/24 [phone]

## 2024-06-22 NOTE — Discharge Instructions (Signed)
 You were seen in the department today for concerns of chest pain, vomiting, and diarrhea.  Your labs and imaging were reassuring.  I suspect likely have some sort of stomach bug called gastroenteritis.  You responded well to nausea medicine/started on this medication for home use for the next several days.  Please follow-up with your primary care provider for further evaluation.  Return to the emergency department for any concerns of new or worsening symptoms.

## 2024-07-10 ENCOUNTER — Ambulatory Visit: Payer: Self-pay

## 2024-07-18 ENCOUNTER — Ambulatory Visit
Admission: RE | Admit: 2024-07-18 | Discharge: 2024-07-18 | Disposition: A | Discharge status: Home / Self Care | Payer: MEDICAID | Source: Ambulatory Visit | Attending: Family Medicine | Admitting: Family Medicine

## 2024-07-18 VITALS — BP 110/76 | HR 82 | Temp 97.9°F | Resp 18 | Ht 63.0 in | Wt 239.6 lb

## 2024-07-18 DIAGNOSIS — H6993 Unspecified Eustachian tube disorder, bilateral: Secondary | ICD-10-CM | POA: Diagnosis not present

## 2024-07-18 DIAGNOSIS — N898 Other specified noninflammatory disorders of vagina: Secondary | ICD-10-CM | POA: Diagnosis present

## 2024-07-18 DIAGNOSIS — K0889 Other specified disorders of teeth and supporting structures: Secondary | ICD-10-CM | POA: Insufficient documentation

## 2024-07-18 DIAGNOSIS — Z113 Encounter for screening for infections with a predominantly sexual mode of transmission: Secondary | ICD-10-CM | POA: Insufficient documentation

## 2024-07-18 NOTE — ED Triage Notes (Signed)
 My ear (both) and throat hurt a little also wanna get tested for stds and yeast infection - Entered by patient  Additional details: Symptoms began with ear pain (both) for about a month but now causing increased pain. She believes the sore throat is from the ear pain. Her wisdom teeth are very painful all the time so not sure if that is related. Also request to have BV and STI swab, no bloodwork as well.

## 2024-07-18 NOTE — Discharge Instructions (Addendum)
 You were seen today for various issues.  Your vaginal swab will be resulted tomorrow and you will be notified of any positive results.  I recommend over the counter claritin/zyrtec  for sinus congestion and fluid behind the ears.  I recommend you see your dentist or an oral surgeon to have the wisdom teeth removed.  I have included a list for you.

## 2024-07-18 NOTE — ED Provider Notes (Signed)
 [name]    CSN: [id number] Arrival date & time: 07/18/24  1455      History   Chief Complaint Chief Complaint  Patient presents with   Ear Drainage   SEXUALLY TRANSMITTED DISEASE    HPI Emily Allen is a 18 y.o. female.    Ear Drainage  Patient is here for bilateral ear pain.  She is having wisdom teeth coming in and that is causing pain.  Her throat hurts off/on as well.  Having runny nose, congestion.  No otc meds taken for ear pain or sore throat.   She also would like BV and STD testing.  Would like this done as she had intercourse without protection.        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 No pertinent surgical history.  OB History   No obstetric history on file.      Home Medications    Prior to Admission medications   Medication Sig Start Date End Date Taking? Authorizing Provider  nitrofurantoin , macrocrystal-monohydrate, (MACROBID ) 100 MG capsule Take 1 capsule (100 mg total) by mouth 2 (two) times daily. 05/09/24   Billy Asberry FALCON, PA-C  ondansetron  (ZOFRAN -ODT) 4 MG disintegrating tablet Take 1 tablet (4 mg total) by mouth every 8 (eight) hours as needed for nausea or vomiting. 06/22/24   Zelaya, Oscar A, PA-C    Family History Family History  Problem Relation Age of Onset   Hyperlipidemia Paternal Grandmother    Thyroid   disease Paternal Grandmother     Social History Social History   Tobacco Use   Smoking status: Never    Passive exposure: Yes   Smokeless tobacco: Never  Vaping Use   Vaping status: Former   Substances: Nicotine, CBD, Flavoring  Substance Use Topics   Alcohol use: Yes    Comment: socially   Drug use: Not Currently    Types: Marijuana    Comment: daily     Allergies   Patient has no known allergies.   Review of Systems Review of Systems  Constitutional: Negative.   HENT:  Positive for dental problem and ear pain.   Respiratory: Negative.    Cardiovascular: Negative.   Gastrointestinal: Negative.   Genitourinary: Negative.   Musculoskeletal: Negative.   Psychiatric/Behavioral: Negative.       Physical Exam Triage Vital Signs ED Triage Vitals  Encounter Vitals Group     BP 07/18/24 1522 110/76     Girls Systolic BP Percentile --      Girls Diastolic BP Percentile --      Boys Systolic BP Percentile --      Boys Diastolic BP Percentile --      Pulse Rate 07/18/24 1522 82     Resp 07/18/24 1522 18     Temp 07/18/24 1522 97.9 F (36.6 C)  Temp Source 07/18/24 1522 Oral     SpO2 07/18/24 1522 96 %     Weight 07/18/24 1520 239 lb 9.6 oz (108.7 kg)     Height 07/18/24 1520 5' 3 (1.6 m)     Head Circumference --      Peak Flow --      Pain Score 07/18/24 1520 6     Pain Loc --      Pain Education --      Exclude from Growth Chart --    No data found.  Updated Vital Signs BP 110/76 (BP Location: Right Arm)   Pulse 82   Temp 97.9 F (36.6 C) (Oral)   Resp 18   Ht 5' 3 (1.6 m)   Wt 108.7 kg   LMP 07/15/2024 (Exact Date)   SpO2 96%   BMI 42.44 kg/m   Visual Acuity Right Eye Distance:   Left Eye Distance:   Bilateral Distance:    Right Eye Near:   Left Eye Near:    Bilateral Near:     Physical Exam Constitutional:      General: She is not in acute distress.    Appearance: Normal appearance. She is normal weight. She is not ill-appearing  or toxic-appearing.  HENT:     Right Ear: A middle ear effusion is present.     Left Ear: A middle ear effusion is present.     Mouth/Throat:     Comments: Wisdom teeth coming in the back/molars;  no redness or swelling noted Cardiovascular:     Rate and Rhythm: Normal rate and regular rhythm.  Pulmonary:     Effort: Pulmonary effort is normal.     Breath sounds: Normal breath sounds.  Neurological:     General: No focal deficit present.     Mental Status: She is alert.  Psychiatric:        Mood and Affect: Mood normal.      UC Treatments / Results  Labs (all labs ordered are listed, but only abnormal results are displayed) Labs Reviewed  CERVICOVAGINAL ANCILLARY ONLY    EKG   Radiology No results found.  Procedures Procedures (including critical care time)  Medications Ordered in UC Medications - No data to display  Initial Impression / Assessment and Plan / UC Course  I have reviewed the triage vital signs and the nursing notes.  Pertinent labs & imaging results that were available during my care of the patient were reviewed by me and considered in my medical decision making (see chart for details).  Final Clinical Impressions(s) / UC Diagnoses   Final diagnoses:  Vaginal discharge  Screening for STD (sexually transmitted disease)  Eustachian tube disorder, bilateral  Tooth pain     Discharge Instructions      You were seen today for various issues.  Your vaginal swab will be resulted tomorrow and you will be notified of any positive results.  I recommend over the counter claritin/zyrtec  for sinus congestion and fluid behind the ears.  I recommend you see your dentist or an oral surgeon to have the wisdom teeth removed.  I have included a list for you.       ED Prescriptions   None    PDMP not reviewed this encounter.   Darral Longs, MD 07/18/24 [phone]

## 2024-07-19 LAB — CERVICOVAGINAL ANCILLARY ONLY
Bacterial Vaginitis (gardnerella): POSITIVE — AB
Candida Glabrata: NEGATIVE
Candida Vaginitis: NEGATIVE
Chlamydia: NEGATIVE
Comment: NEGATIVE
Comment: NEGATIVE
Comment: NEGATIVE
Comment: NEGATIVE
Comment: NEGATIVE
Comment: NORMAL
Neisseria Gonorrhea: NEGATIVE
Trichomonas: NEGATIVE

## 2024-07-20 ENCOUNTER — Ambulatory Visit (HOSPITAL_COMMUNITY): Payer: Self-pay

## 2024-07-20 MED ORDER — METRONIDAZOLE 500 MG PO TABS: 500.0000 mg | ORAL_TABLET | Freq: Two times a day (BID) | ORAL | 0 refills | Status: AC

## 2024-08-06 ENCOUNTER — Ambulatory Visit: Payer: Self-pay

## 2024-08-06 ENCOUNTER — Other Ambulatory Visit: Payer: Self-pay

## 2024-08-06 ENCOUNTER — Encounter (HOSPITAL_COMMUNITY): Payer: Self-pay

## 2024-08-06 ENCOUNTER — Emergency Department (HOSPITAL_COMMUNITY)
Admission: EM | Admit: 2024-08-06 | Discharge: 2024-08-06 | Disposition: A | Discharge status: Home / Self Care | Payer: MEDICAID | Attending: Emergency Medicine | Admitting: Emergency Medicine

## 2024-08-06 DIAGNOSIS — E039 Hypothyroidism, unspecified: Secondary | ICD-10-CM | POA: Insufficient documentation

## 2024-08-06 DIAGNOSIS — N3000 Acute cystitis without hematuria: Secondary | ICD-10-CM | POA: Insufficient documentation

## 2024-08-06 DIAGNOSIS — J45909 Unspecified asthma, uncomplicated: Secondary | ICD-10-CM | POA: Insufficient documentation

## 2024-08-06 LAB — CBC
HCT: 41.7 % (ref 36.0–46.0)
Hemoglobin: 13.9 g/dL (ref 12.0–15.0)
MCH: 27.7 pg (ref 26.0–34.0)
MCHC: 33.3 g/dL (ref 30.0–36.0)
MCV: 83.2 fL (ref 80.0–100.0)
Platelets: 304 K/uL (ref 150–400)
RBC: 5.01 MIL/uL (ref 3.87–5.11)
RDW: 12.9 % (ref 11.5–15.5)
WBC: 10.4 K/uL (ref 4.0–10.5)
nRBC: 0 % (ref 0.0–0.2)

## 2024-08-06 LAB — URINALYSIS, ROUTINE W REFLEX MICROSCOPIC
Bilirubin Urine: NEGATIVE
Glucose, UA: 50 mg/dL — AB
Ketones, ur: 5 mg/dL — AB
Nitrite: POSITIVE — AB
Protein, ur: 100 mg/dL — AB
Specific Gravity, Urine: 1.019 (ref 1.005–1.030)
WBC, UA: >50 WBC/hpf (ref 0–5)
pH: 6 (ref 5.0–8.0)

## 2024-08-06 LAB — COMPREHENSIVE METABOLIC PANEL WITH GFR
ALT: 15 U/L (ref 0–44)
AST: 14 U/L — ABNORMAL LOW (ref 15–41)
Albumin: 3.8 g/dL (ref 3.5–5.0)
Alkaline Phosphatase: 56 U/L (ref 38–126)
Anion gap: 10 (ref 5–15)
BUN: 10 mg/dL (ref 6–20)
CO2: 23 mmol/L (ref 22–32)
Calcium: 8.8 mg/dL — ABNORMAL LOW (ref 8.9–10.3)
Chloride: 105 mmol/L (ref 98–111)
Creatinine, Ser: 0.65 mg/dL (ref 0.44–1.00)
GFR, Estimated: >60 mL/min (ref >60)
Glucose, Bld: 89 mg/dL (ref 70–99)
Potassium: 4.2 mmol/L (ref 3.5–5.1)
Sodium: 138 mmol/L (ref 135–145)
Total Bilirubin: 0.7 mg/dL (ref 0.0–1.2)
Total Protein: 6.9 g/dL (ref 6.5–8.1)

## 2024-08-06 LAB — HCG, SERUM, QUALITATIVE: Preg, Serum: NEGATIVE

## 2024-08-06 MED ORDER — SULFAMETHOXAZOLE-TRIMETHOPRIM 800-160 MG PO TABS: 1.0000 | ORAL_TABLET | Freq: Two times a day (BID) | ORAL | 0 refills | Status: AC

## 2024-08-06 MED ORDER — SULFAMETHOXAZOLE-TRIMETHOPRIM 800-160 MG PO TABS
1.0000 | ORAL_TABLET | Freq: Once | ORAL | Status: AC
  Administered 2024-08-06: 1 via ORAL
  Filled 2024-08-06: qty 1

## 2024-08-06 NOTE — ED Triage Notes (Signed)
 PT c/o dysuria since yesterday.  Also c/o lower abdominal and lower back pain.

## 2024-08-06 NOTE — ED Provider Notes (Signed)
 [place] EMERGENCY DEPARTMENT AT [facility] Provider Note   CSN: [id number] Arrival date & time: 08/06/24  1740     Patient presents with: Dysuria   Emily Allen is a 18 y.o. female.   Pt is a 18 yo female with pmhx significant for hypothyroidism, hld, anxiety, depression, and asthma.  She has had some lower abd pain since yesterday.  She has had dysuria and frequency.  She took some otc AZOs and that has helped the pain. She denies f/c.         Prior to Admission medications   Medication Sig Start Date End Date Taking? Authorizing Provider  sulfamethoxazole -trimethoprim  (BACTRIM  DS) 800-160 MG tablet Take 1 tablet by mouth 2 (two) times daily for 7 days. 08/06/24 08/13/24 Yes Dean Clarity, MD  nitrofurantoin , macrocrystal-monohydrate, (MACROBID ) 100 MG capsule Take 1 capsule (100 mg total) by mouth 2 (two) times daily. 05/09/24   Billy Asberry FALCON, PA-C  ondansetron  (ZOFRAN -ODT) 4 MG disintegrating tablet Take 1 tablet (4 mg total) by mouth every 8 (eight) hours as needed for nausea or vomiting. 06/22/24   Zelaya, Oscar A, PA-C    Allergies: Patient has no known allergies.    Review of Systems  Gastrointestinal:  Positive for abdominal pain.  Genitourinary:  Positive for dysuria and frequency.  All other systems reviewed and are negative.   Updated Vital Signs BP 118/70 (BP Location: Right Arm)   Pulse 92   Temp 98.1 F (36.7 C)   Resp 17   Ht 5' 3 (1.6 m)   Wt 104.3 kg   LMP 07/15/2024 (Approximate)   SpO2 98%   BMI 40.74 kg/m   Physical Exam Vitals and nursing note reviewed.  Constitutional:      Appearance: Normal appearance. She is obese.  HENT:     Head: Normocephalic and atraumatic.     Right Ear: External ear normal.     Left Ear: External ear normal.     Nose: Nose normal.     Mouth/Throat:     Mouth: Mucous membranes are moist.     Pharynx: Oropharynx is clear.  Eyes:     Extraocular Movements: Extraocular movements intact.      Conjunctiva/sclera: Conjunctivae normal.     Pupils: Pupils are equal, round, and reactive to light.  Cardiovascular:     Rate and Rhythm: Normal rate and regular rhythm.     Pulses: Normal pulses.     Heart sounds: Normal heart sounds.  Pulmonary:     Effort: Pulmonary effort is normal.     Breath sounds: Normal breath sounds.  Abdominal:     General: Abdomen is flat. Bowel sounds are normal.     Palpations: Abdomen is soft.     Tenderness: There is abdominal tenderness in the suprapubic area.  Musculoskeletal:        General: Normal range of motion.     Cervical back: Normal range of motion and neck supple.  Skin:    General: Skin is warm.     Capillary Refill: Capillary refill takes less than 2 seconds.  Neurological:     General: No focal deficit present.     Mental Status: She is alert and oriented to person, place, and time.  Psychiatric:        Mood and Affect: Mood normal.        Behavior: Behavior normal.     (all labs ordered are listed, but only abnormal results are displayed) Labs Reviewed  COMPREHENSIVE METABOLIC  PANEL WITH GFR - Abnormal; Notable for the following components:      Result Value   Calcium 8.8 (*)    AST 14 (*)    All other components within normal limits  URINALYSIS, ROUTINE W REFLEX MICROSCOPIC - Abnormal; Notable for the following components:   Color, Urine AMBER (*)    APPearance HAZY (*)    Glucose, UA 50 (*)    Hgb urine dipstick SMALL (*)    Ketones, ur 5 (*)    Protein, ur 100 (*)    Nitrite POSITIVE (*)    Leukocytes,Ua TRACE (*)    Bacteria, UA RARE (*)    All other components within normal limits  CBC  HCG, SERUM, QUALITATIVE    EKG: None  Radiology: No results found.   Procedures   Medications Ordered in the ED  sulfamethoxazole -trimethoprim  (BACTRIM  DS) 800-160 MG per tablet 1 tablet (has no administration in time range)                                    Medical Decision Making Amount and/or Complexity of  Data Reviewed Labs: ordered.  Risk Prescription drug management.   This patient presents to the ED for concern of dysuria, this involves an extensive number of treatment options, and is a complaint that carries with it a high risk of complications and morbidity.  The differential diagnosis includes uti, pregnancy   Co morbidities that complicate the patient evaluation  hypothyroidism, hld, anxiety, depression, and asthma   Additional history obtained:  Additional history obtained from epic chart review External records from outside source obtained and reviewed including family   Lab Tests:  I Ordered, and personally interpreted labs.  The pertinent results include:  cbc nl, cmp nl, preg neg, ua + for uti   Medicines ordered and prescription drug management:  I ordered medication including bactrim   for sx  Reevaluation of the patient after these medicines showed that the patient improved I have reviewed the patients home medicines and have made adjustments as needed   Problem List / ED Course:  Uti:  pt has sx c/w uti, so she is d/c with bactrim .  She is to return if worse.  F/u with pcp.   Reevaluation:  After the interventions noted above, I reevaluated the patient and found that they have :improved   Social Determinants of Health:  Lives at home   Dispostion:  After consideration of the diagnostic results and the patients response to treatment, I feel that the patent would benefit from discharge with outpatient f/u.       Final diagnoses:  Acute cystitis without hematuria    ED Discharge Orders          Ordered    sulfamethoxazole -trimethoprim  (BACTRIM  DS) 800-160 MG tablet  2 times daily        08/06/24 1821               Dean Clarity, MD 08/06/24 [phone]

## 2024-08-17 ENCOUNTER — Encounter: Payer: MEDICAID | Admitting: Obstetrics

## 2024-08-23 ENCOUNTER — Encounter (HOSPITAL_COMMUNITY): Payer: Self-pay

## 2024-08-23 ENCOUNTER — Other Ambulatory Visit: Payer: Self-pay

## 2024-08-23 ENCOUNTER — Inpatient Hospital Stay (HOSPITAL_COMMUNITY): Payer: MEDICAID

## 2024-08-23 ENCOUNTER — Inpatient Hospital Stay (HOSPITAL_COMMUNITY)
Admission: AD | Admit: 2024-08-23 | Discharge: 2024-08-23 | Disposition: A | Discharge status: Home / Self Care | Payer: MEDICAID | Attending: Obstetrics and Gynecology | Admitting: Obstetrics and Gynecology

## 2024-08-23 ENCOUNTER — Other Ambulatory Visit: Payer: Self-pay | Admitting: Certified Nurse Midwife

## 2024-08-23 DIAGNOSIS — O99282 Endocrine, nutritional and metabolic diseases complicating pregnancy, second trimester: Secondary | ICD-10-CM | POA: Insufficient documentation

## 2024-08-23 DIAGNOSIS — R109 Unspecified abdominal pain: Secondary | ICD-10-CM

## 2024-08-23 DIAGNOSIS — O26891 Other specified pregnancy related conditions, first trimester: Secondary | ICD-10-CM | POA: Diagnosis not present

## 2024-08-23 DIAGNOSIS — O26892 Other specified pregnancy related conditions, second trimester: Secondary | ICD-10-CM | POA: Diagnosis present

## 2024-08-23 DIAGNOSIS — O99332 Smoking (tobacco) complicating pregnancy, second trimester: Secondary | ICD-10-CM | POA: Insufficient documentation

## 2024-08-23 DIAGNOSIS — Z3A01 Less than 8 weeks gestation of pregnancy: Secondary | ICD-10-CM | POA: Diagnosis not present

## 2024-08-23 DIAGNOSIS — R110 Nausea: Secondary | ICD-10-CM | POA: Diagnosis not present

## 2024-08-23 DIAGNOSIS — E785 Hyperlipidemia, unspecified: Secondary | ICD-10-CM | POA: Diagnosis not present

## 2024-08-23 DIAGNOSIS — E039 Hypothyroidism, unspecified: Secondary | ICD-10-CM | POA: Insufficient documentation

## 2024-08-23 DIAGNOSIS — F17290 Nicotine dependence, other tobacco product, uncomplicated: Secondary | ICD-10-CM | POA: Insufficient documentation

## 2024-08-23 DIAGNOSIS — E88819 Insulin resistance, unspecified: Secondary | ICD-10-CM | POA: Diagnosis not present

## 2024-08-23 DIAGNOSIS — R1030 Lower abdominal pain, unspecified: Secondary | ICD-10-CM | POA: Diagnosis not present

## 2024-08-23 DIAGNOSIS — M545 Low back pain, unspecified: Secondary | ICD-10-CM | POA: Diagnosis not present

## 2024-08-23 LAB — CBC
HCT: 38.1 % (ref 36.0–46.0)
Hemoglobin: 12.7 g/dL (ref 12.0–15.0)
MCH: 28.2 pg (ref 26.0–34.0)
MCHC: 33.3 g/dL (ref 30.0–36.0)
MCV: 84.7 fL (ref 80.0–100.0)
Platelets: 320 K/uL (ref 150–400)
RBC: 4.5 MIL/uL (ref 3.87–5.11)
RDW: 13.2 % (ref 11.5–15.5)
WBC: 10.4 K/uL (ref 4.0–10.5)
nRBC: 0 % (ref 0.0–0.2)

## 2024-08-23 LAB — HCG, QUANTITATIVE, PREGNANCY: hCG, Beta Chain, Quant, S: 2232 m[IU]/mL — ABNORMAL HIGH

## 2024-08-23 LAB — URINALYSIS, ROUTINE W REFLEX MICROSCOPIC
Bilirubin Urine: NEGATIVE
Glucose, UA: NEGATIVE mg/dL
Hgb urine dipstick: NEGATIVE
Ketones, ur: NEGATIVE mg/dL
Leukocytes,Ua: NEGATIVE
Nitrite: NEGATIVE
Protein, ur: NEGATIVE mg/dL
Specific Gravity, Urine: 1.024 (ref 1.005–1.030)
pH: 6 (ref 5.0–8.0)

## 2024-08-23 LAB — COMPREHENSIVE METABOLIC PANEL WITH GFR
ALT: 24 U/L (ref 0–44)
AST: 16 U/L (ref 15–41)
Albumin: 4.3 g/dL (ref 3.5–5.0)
Alkaline Phosphatase: 62 U/L (ref 38–126)
Anion gap: 11 (ref 5–15)
BUN: 8 mg/dL (ref 6–20)
CO2: 22 mmol/L (ref 22–32)
Calcium: 8.9 mg/dL (ref 8.9–10.3)
Chloride: 104 mmol/L (ref 98–111)
Creatinine, Ser: 0.67 mg/dL (ref 0.44–1.00)
GFR, Estimated: >60 mL/min
Glucose, Bld: 90 mg/dL (ref 70–99)
Potassium: 4 mmol/L (ref 3.5–5.1)
Sodium: 137 mmol/L (ref 135–145)
Total Bilirubin: 0.2 mg/dL (ref 0.0–1.2)
Total Protein: 6.5 g/dL (ref 6.5–8.1)

## 2024-08-23 LAB — WET PREP, GENITAL
Sperm: NONE SEEN
Trich, Wet Prep: NONE SEEN
WBC, Wet Prep HPF POC: <10
Yeast Wet Prep HPF POC: NONE SEEN

## 2024-08-23 LAB — LIPASE, BLOOD: Lipase: 17 U/L (ref 11–51)

## 2024-08-23 LAB — GC/CHLAMYDIA PROBE AMP (~~LOC~~) NOT AT ARMC
Chlamydia: NEGATIVE
Comment: NEGATIVE
Comment: NORMAL
Neisseria Gonorrhea: NEGATIVE

## 2024-08-23 LAB — ABO/RH: ABO/RH(D): O POS

## 2024-08-23 LAB — POC URINE PREG, ED: Preg Test, Ur: POSITIVE — AB

## 2024-08-23 LAB — HCG, SERUM, QUALITATIVE: Preg, Serum: POSITIVE — AB

## 2024-08-23 MED ORDER — METRONIDAZOLE 500 MG PO TABS: 500.0000 mg | ORAL_TABLET | Freq: Two times a day (BID) | ORAL | 0 refills | Status: DC

## 2024-08-23 NOTE — ED Provider Triage Note (Signed)
 Emergency Medicine Provider OB Triage Evaluation Note  Emily Allen is a 18 y.o. female, No obstetric history on file., at Unknown gestation who presents to the emergency department with complaints of right sided abdominal pain x3 days.  Unsure of LMP-- no cycle in December, cannot remember if she had a cycle in November.  Did have a negative pregnancy test in the ED earlier this month.  Review of  Systems  Positive: abdominal pain Negative: fever  Physical Exam  BP 124/68 (BP Location: Right Arm)   Pulse 76   Temp (!) 97.5 F (36.4 C) (Oral)   Resp 18   Ht 5' 3 (1.6 m)   Wt 102.1 kg   LMP 07/15/2024 (Approximate)   SpO2 100%   BMI 39.86 kg/m  General: Awake, no distress  HEENT: Atraumatic  Resp: Normal effort  Cardiac: Normal rate Abd: Nondistended, nontender  MSK: Moves all extremities without difficulty Neuro: Speech clear  Medical Decision Making  Pt evaluated for pregnancy concern and is stable for transfer to MAU. Pt is in agreement with plan for transfer.  5:46 AM Discussed with MAU APP, who accepts patient in transfer.  Clinical Impression    Pregnancy      Jarold Olam HERO, PA-C 08/23/24 [phone]

## 2024-08-23 NOTE — ED Triage Notes (Signed)
 Complaining of lower abdominal and back pain that started 3 days ago. She also said that she has missed her menstrual cycle this month.

## 2024-08-23 NOTE — MAU Note (Signed)
 Pt says she went to [facility] ER at 0530- for lower abd pain and back pain , nausea, had missed her cycle- . They preg test- positive. Abd pain and back pain started at 2300. Nausea started at 0200.

## 2024-08-23 NOTE — Discharge Instructions (Signed)

## 2024-08-23 NOTE — MAU Provider Note (Signed)
 " History     CSN: [id number]  Arrival date and time: 08/23/24 0509   Event Date/Time   First Provider Initiated Contact with Patient 08/23/24 [phone]      Chief Complaint  Patient presents with   Abdominal Pain   Back Pain   Nausea   Emily Allen , a  18 y.o. G1P0 at [redacted]w[redacted]d presents to MAU with complaints of lower abdominal pain and back pain. Patient reports pain started 3 days and has gotten progressively worse. Intermittent back pain that comes and goes and is worse with standing. Lower abdominal pain is also intermittent and is described as a cramping sensation. Denies vaginal bleeding, abnormal vaginal discharge and urinary symptoms.   This was an unplanned pregnancy but desires.          OB History     Gravida  1   Para      Term      Preterm      AB      Living         SAB      IAB      Ectopic      Multiple      Live Births              Past Medical History:  Diagnosis Date   Anxiety    Phreesia 04/27/2020   Asthma    Depression    Phreesia 04/27/2020   Hyperlipidemia    Phreesia 04/27/2020   Hypothyroidism    Insulin resistance    Thyroid  disease    Phreesia 04/27/2020    History reviewed. No pertinent surgical history.  Family History  Problem Relation Age of Onset   Hyperlipidemia Paternal Grandmother    Thyroid  disease Paternal Grandmother     Social History[1]  Allergies: Allergies[2]  Medications Prior to Admission  Medication Sig Dispense Refill Last Dose/Taking   nitrofurantoin , macrocrystal-monohydrate, (MACROBID ) 100 MG capsule Take 1 capsule (100 mg total) by mouth 2 (two) times daily. 10 capsule 0 More than a month   ondansetron  (ZOFRAN -ODT) 4 MG disintegrating tablet Take 1 tablet (4 mg total) by mouth every 8 (eight) hours as needed for nausea or vomiting. 20 tablet 0 More than a month    Review of Systems  Constitutional:  Negative for chills, fatigue and fever.  Eyes:  Negative for pain and visual  disturbance.  Respiratory:  Negative for apnea, shortness of breath and wheezing.   Cardiovascular:  Negative for chest pain and palpitations.  Gastrointestinal:  Positive for abdominal pain. Negative for constipation, diarrhea, nausea and vomiting.  Genitourinary:  Negative for difficulty urinating, dysuria, pelvic pain, vaginal bleeding, vaginal discharge and vaginal pain.  Musculoskeletal:  Positive for back pain.  Neurological:  Negative for seizures, weakness and headaches.  Psychiatric/Behavioral:  Negative for suicidal ideas.    Physical Exam   Blood pressure 131/65, pulse 74, temperature (!) 97.5 F (36.4 C), temperature source Oral, resp. rate 12, height 5' 3 (1.6 m), weight 109.4 kg, last menstrual period 07/15/2024, SpO2 100%.  Physical Exam Vitals and nursing note reviewed.  Constitutional:      General: She is not in acute distress.    Appearance: Normal appearance. She is not ill-appearing.  HENT:     Head: Normocephalic.  Pulmonary:     Effort: Pulmonary effort is normal.  Abdominal:     Tenderness: There is no abdominal tenderness.  Musculoskeletal:     Cervical back: Normal range of motion.  Skin:  General: Skin is warm and dry.  Neurological:     Mental Status: She is alert and oriented to person, place, and time.  Psychiatric:        Mood and Affect: Mood normal.     MAU Course  Procedures Orders Placed This Encounter  Procedures   Wet prep, genital   US  OB LESS THAN 14 WEEKS WITH OB TRANSVAGINAL   Lipase, blood   Comprehensive metabolic panel   Urinalysis, Routine w reflex microscopic -Urine, Clean Catch   hCG, serum, qualitative   CBC   hCG, quantitative, pregnancy   Diet NPO time specified   POC Urine Pregnancy, ED (not at [facility] or [facility])   ABO/Rh   Discharge patient Discharge disposition: 01-Home or Self Care; Discharge patient date: 08/23/2024   Results for orders placed or performed during the hospital encounter of 08/23/24 (from the past  24 hours)  POC Urine Pregnancy, ED (not at [facility] or [facility])     Status: Abnormal   Collection Time: 08/23/24  5:15 AM  Result Value Ref Range   Preg Test, Ur Positive (A) Negative  Urinalysis, Routine w reflex microscopic -Urine, Clean Catch     Status: Abnormal   Collection Time: 08/23/24  5:16 AM  Result Value Ref Range   Color, Urine YELLOW YELLOW   APPearance CLEAR CLEAR   Specific Gravity, Urine 1.024 1.005 - 1.030   pH 6.0 5.0 - 8.0   Glucose, UA NEGATIVE NEGATIVE mg/dL   Hgb urine dipstick NEGATIVE NEGATIVE   Bilirubin Urine NEGATIVE NEGATIVE   Ketones, ur NEGATIVE NEGATIVE mg/dL   Protein, ur NEGATIVE NEGATIVE mg/dL   Nitrite NEGATIVE NEGATIVE   Leukocytes,Ua NEGATIVE NEGATIVE   RBC / HPF 0-5 0 - 5 RBC/hpf   WBC, UA 0-5 0 - 5 WBC/hpf   Bacteria, UA RARE (A) NONE SEEN   Squamous Epithelial / HPF 0-5 0 - 5 /HPF   Mucus PRESENT   Wet prep, genital     Status: Abnormal   Collection Time: 08/23/24  6:15 AM  Result Value Ref Range   Yeast Wet Prep HPF POC NONE SEEN NONE SEEN   Trich, Wet Prep NONE SEEN NONE SEEN   Clue Cells Wet Prep HPF POC PRESENT (A) NONE SEEN   WBC, Wet Prep HPF POC <10 <10   Sperm NONE SEEN   Lipase, blood     Status: None   Collection Time: 08/23/24  6:52 AM  Result Value Ref Range   Lipase 17 11 - 51 U/L  Comprehensive metabolic panel     Status: None   Collection Time: 08/23/24  6:52 AM  Result Value Ref Range   Sodium 137 135 - 145 mmol/L   Potassium 4.0 3.5 - 5.1 mmol/L   Chloride 104 98 - 111 mmol/L   CO2 22 22 - 32 mmol/L   Glucose, Bld 90 70 - 99 mg/dL   BUN 8 6 - 20 mg/dL   Creatinine, Ser 9.32 0.44 - 1.00 mg/dL   Calcium 8.9 8.9 - 89.6 mg/dL   Total Protein 6.5 6.5 - 8.1 g/dL   Albumin 4.3 3.5 - 5.0 g/dL   AST 16 15 - 41 U/L   ALT 24 0 - 44 U/L   Alkaline Phosphatase 62 38 - 126 U/L   Total Bilirubin 0.2 0.0 - 1.2 mg/dL   GFR, Estimated >39 >39 mL/min   Anion gap 11 5 - 15  CBC     Status: None   Collection Time:  08/23/24  6:52 AM   Result Value Ref Range   WBC 10.4 4.0 - 10.5 K/uL   RBC 4.50 3.87 - 5.11 MIL/uL   Hemoglobin 12.7 12.0 - 15.0 g/dL   HCT 61.8 63.9 - 53.9 %   MCV 84.7 80.0 - 100.0 fL   MCH 28.2 26.0 - 34.0 pg   MCHC 33.3 30.0 - 36.0 g/dL   RDW 86.7 88.4 - 84.4 %   Platelets 320 150 - 400 K/uL   nRBC 0.0 0.0 - 0.2 %  ABO/Rh     Status: None   Collection Time: 08/23/24  6:52 AM  Result Value Ref Range   ABO/RH(D) O POS    No rh immune globuloin      NOT A RH IMMUNE GLOBULIN CANDIDATE, PT RH POSITIVE Performed at [facility], [address], [place], [place] [address]   hCG, quantitative, pregnancy     Status: Abnormal   Collection Time: 08/23/24  6:52 AM  Result Value Ref Range   hCG, Beta Chain, Quant, S 2,232 (H) <5 mIU/mL  US  OB LESS THAN 14 WEEKS WITH OB TRANSVAGINAL Result Date: 08/23/2024 CLINICAL DATA:  Lower abdominal and back pain. Positive pregnancy test. EXAM: OBSTETRIC <14 WK US  AND TRANSVAGINAL OB US  TECHNIQUE: Both transabdominal and transvaginal ultrasound examinations were performed for complete evaluation of the gestation as well as the maternal uterus, adnexal regions, and pelvic cul-de-sac. Transvaginal technique was performed to assess early pregnancy. COMPARISON:  None Available. FINDINGS: Intrauterine gestational sac: Single Yolk sac:  Visualized Embryo:  Not visualized Cardiac Activity: N/A MSD: 6.5 mm   5 w   2d Subchorionic hemorrhage:  None visualized. Maternal uterus/adnexae: Right ovary measures 2.9 x 1.7 x 1.8 cm. Left ovary measures 3.1 x 2.3 x 2.1 cm. No adnexal mass. No substantial free fluid. IMPRESSION: Single intrauterine gestational sac identified with yolk sac visible but no embryo yet evident. Estimated gestational age of [redacted] week 2 day by mean sac diameter. Electronically Signed   By: Camellia Candle M.D.   On: 08/23/2024 06:49     MDM - HCG >2K  - CBC normal  - CMP normal  - Wet prep positive for clue cells, and given lower abdominal pain and discomfort  plan to treat for BV.  -UA positive for bacteria but otherwise normal  - US  revealed a single IUP measuring ~5weeks 2 days. Consistent with HCG levels for gestation. Low suspicion for ectopic pregnancy.  - plan for discharge.     Assessment and Plan   1. Abdominal pain, unspecified abdominal location   2. Low back pain, unspecified back pain laterality, unspecified chronicity, unspecified whether sciatica present   3. Nausea   4. [redacted] weeks gestation of pregnancy   - Discussed with patient abdominal pain is likely related to BV. Discussed STDs and vaginal infections may cause abdominal pain.  - Rx for Metronidazole  sent to outpatient pharmacy.  - Recommended that patient establish care at at [facility] of her choosing, List of local practices provided.  - Reviewed worsening signs and return precautions.  - Patient discharged home in stable condition and may return to MAU as needed.    Claris CHRISTELLA Cedar, MSN CNM  08/23/2024, 7:37 AM      [1]  Social History Tobacco Use   Smoking status: Never    Passive exposure: Yes   Smokeless tobacco: Never  Vaping Use   Vaping status: Every Day   Substances: Nicotine, CBD, Flavoring  Substance Use Topics  Alcohol use: Yes    Comment: socially   Drug use: Not Currently    Types: Marijuana    Comment: daily  [2] No Known Allergies  "

## 2024-09-04 ENCOUNTER — Ambulatory Visit (HOSPITAL_COMMUNITY): Payer: Self-pay

## 2024-09-09 ENCOUNTER — Ambulatory Visit
Admission: EM | Admit: 2024-09-09 | Discharge: 2024-09-09 | Disposition: A | Discharge status: Home / Self Care | Payer: MEDICAID

## 2024-09-09 ENCOUNTER — Encounter: Payer: Self-pay | Admitting: Emergency Medicine

## 2024-09-09 DIAGNOSIS — N3000 Acute cystitis without hematuria: Secondary | ICD-10-CM | POA: Diagnosis present

## 2024-09-09 LAB — POCT URINE DIPSTICK
Bilirubin, UA: NEGATIVE
Blood, UA: NEGATIVE
Glucose, UA: NEGATIVE mg/dL
Ketones, POC UA: NEGATIVE mg/dL
Nitrite, UA: NEGATIVE
POC PROTEIN,UA: NEGATIVE
Spec Grav, UA: 1.015
Urobilinogen, UA: 0.2 U/dL
pH, UA: 7

## 2024-09-09 MED ORDER — AMOXICILLIN-POT CLAVULANATE 500-125 MG PO TABS: 1.0000 | ORAL_TABLET | Freq: Two times a day (BID) | ORAL | 0 refills | Status: AC

## 2024-09-09 NOTE — ED Triage Notes (Signed)
 Pt reports dysuria and frequency that started this morning. Notes general discomfort to vaginal area. Pt is currently pregnant - EDD 04/21/25. No med use for symptoms.

## 2024-09-09 NOTE — Discharge Instructions (Addendum)
 Augmentin  is considered safe in pregnancy. You may only take Tylenol  while pregnant for pain or fever. Do not take aspirin, ibuprofen , or naprosyn while pregnant it can hurt the baby

## 2024-09-09 NOTE — ED Provider Notes (Signed)
 " [name]    CSN: [id number] Arrival date & time: 09/09/24  1836      History   Chief Complaint Chief Complaint  Patient presents with   Dysuria   Urinary Frequency    HPI Emily Allen is a 19 y.o. female.   Pt, that is [redacted] weeks pregnant, states that she has been experiencing dysuria and frequency since yesterday. Pt denies fever, chills, nausea, or vomiting.  The history is provided by the patient.  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 No pertinent surgical history.  OB History     Gravida  1   Para      Term      Preterm      AB      Living         SAB      IAB      Ectopic      Multiple      Live Births               Home Medications    Prior to Admission medications  Medication Sig Start Date End Date Taking? Authorizing Provider  amoxicillin -clavulanate (AUGMENTIN ) 500-125 MG tablet Take 1 tablet by mouth in the morning and at bedtime for 5 days. 09/09/24 09/14/24 Yes Andra Corean BROCKS, PA-C    Family History Family History  Problem Relation Age of Onset   Hyperlipidemia Paternal Grandmother    Thyroid  disease Paternal Grandmother     Social History Social History[1]   Allergies   Patient has no known allergies.   Review of Systems Review of Systems  Genitourinary:  Positive  for dysuria and frequency.     Physical Exam Triage Vital Signs ED Triage Vitals  Encounter Vitals Group     BP 09/09/24 1845 105/69     Girls Systolic BP Percentile --      Girls Diastolic BP Percentile --      Boys Systolic BP Percentile --      Boys Diastolic BP Percentile --      Pulse Rate 09/09/24 1845 70     Resp 09/09/24 1845 20     Temp 09/09/24 1845 97.9 F (36.6 C)     Temp Source 09/09/24 1845 Oral     SpO2 09/09/24 1845 98 %     Weight --      Height --      Head Circumference --      Peak Flow --      Pain Score 09/09/24 1909 0     Pain Loc --      Pain Education --      Exclude from Growth Chart --    No data found.  Updated Vital Signs BP 105/69 (BP Location: Right Arm)   Pulse  70   Temp 97.9 F (36.6 C) (Oral)   Resp 20   LMP 07/15/2024 (Approximate)   SpO2 98%   Visual Acuity Right Eye Distance:   Left Eye Distance:   Bilateral Distance:    Right Eye Near:   Left Eye Near:    Bilateral Near:     Physical Exam Vitals and nursing note reviewed.  Constitutional:      General: She is not in acute distress.    Appearance: Normal appearance. She is not ill-appearing, toxic-appearing or diaphoretic.  Eyes:     General: No scleral icterus. Cardiovascular:     Rate and Rhythm: Normal rate and regular rhythm.     Heart sounds: Normal heart sounds.  Pulmonary:     Effort: Pulmonary effort is normal. No respiratory distress.     Breath sounds: Normal breath sounds. No wheezing or rhonchi.  Abdominal:     General: Abdomen is flat. Bowel sounds are normal.     Palpations: Abdomen is soft.     Tenderness: There is no abdominal tenderness. There is no right CVA tenderness or left CVA tenderness.  Skin:    General: Skin is warm.  Neurological:     Mental Status: She is alert and oriented to person, place, and time.  Psychiatric:        Mood and Affect: Mood normal.        Behavior: Behavior normal.      UC Treatments / Results  Labs (all  labs ordered are listed, but only abnormal results are displayed) Labs Reviewed  POCT URINE DIPSTICK - Abnormal; Notable for the following components:      Result Value   Clarity, UA hazy (*)    Leukocytes, UA Small (1+) (*)    All other components within normal limits  URINE CULTURE    EKG   Radiology No results found.  Procedures Procedures (including critical care time)  Medications Ordered in UC Medications - No data to display  Initial Impression / Assessment and Plan / UC Course  I have reviewed the triage vital signs and the nursing notes.  Pertinent labs & imaging results that were available during my care of the patient were reviewed by me and considered in my medical decision making (see chart for details).    Final Clinical Impressions(s) / UC Diagnoses   Final diagnoses:  Acute cystitis without hematuria     Discharge Instructions      Augmentin  is considered safe in pregnancy. You may only take Tylenol  while pregnant for pain or fever. Do not take aspirin, ibuprofen , or naprosyn while pregnant it can hurt the baby    ED Prescriptions     Medication Sig Dispense Auth. Provider   amoxicillin -clavulanate (AUGMENTIN ) 500-125 MG tablet Take 1 tablet by mouth in the morning and at bedtime for 5 days. 10 tablet Andra Corean BROCKS, PA-C      PDMP not reviewed this encounter.    [1]  Social History Tobacco Use   Smoking status: Never    Passive exposure: Yes   Smokeless tobacco: Never  Vaping Use   Vaping status: Former   Substances: Nicotine, CBD, Flavoring  Substance Use Topics   Alcohol use: Not Currently    Comment: socially   Drug use: Not Currently    Types: Marijuana    Comment: daily     Andra Corean BROCKS DEVONNA 09/09/24 1922  "

## 2024-09-14 ENCOUNTER — Ambulatory Visit (HOSPITAL_COMMUNITY): Payer: Self-pay

## 2024-09-14 LAB — URINE CULTURE: Culture: >=100000 — AB

## 2024-09-20 ENCOUNTER — Ambulatory Visit
Admission: RE | Admit: 2024-09-20 | Discharge: 2024-09-20 | Disposition: A | Discharge status: Home / Self Care | Payer: MEDICAID

## 2024-09-20 VITALS — BP 108/68 | HR 80 | Temp 97.3°F | Resp 18 | Wt 241.2 lb

## 2024-09-20 DIAGNOSIS — N912 Amenorrhea, unspecified: Secondary | ICD-10-CM

## 2024-09-20 LAB — POCT URINE PREGNANCY: Preg Test, Ur: POSITIVE — AB

## 2024-09-20 NOTE — ED Provider Notes (Signed)
 " [name]    CSN: [id number] Arrival date & time: 09/20/24  1309      History   Chief Complaint Chief Complaint  Patient presents with   Possible Pregnancy    Pt is currently [redacted] weeks pregnant.    HPI Emily Allen is a 19 y.o. female.   Pt that is [redacted] weeks pregnant, presents today for proof of pregnancy.   The history is provided by the patient.  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 No pertinent surgical history.  OB History     Gravida  1   Para      Term      Preterm      AB      Living         SAB      IAB      Ectopic      Multiple      Live Births               Home Medications    Prior to Admission medications  Not on File    Family History Family History  Problem Relation Age of Onset   Hyperlipidemia Paternal Grandmother    Thyroid  disease Paternal Grandmother     Social History Social History[1]   Allergies   Patient has no known allergies.   Review of Systems Review of Systems   Physical Exam Triage Vital Signs ED Triage Vitals [09/20/24 1347]  Encounter Vitals Group     BP 108/68     Girls Systolic BP Percentile      Girls Diastolic BP Percentile      Boys Systolic BP Percentile      Boys Diastolic BP Percentile      Pulse Rate 80     Resp 18     Temp (!) 97.3 F (36.3  C)     Temp Source Oral     SpO2 97 %     Weight 241 lb 2.9 oz (109.4 kg)     Height      Head Circumference      Peak Flow      Pain Score      Pain Loc      Pain Education      Exclude from Growth Chart    No data found.  Updated Vital Signs BP 108/68 (BP Location: Left Arm)   Pulse 80   Temp (!) 97.3 F (36.3 C) (Oral)   Resp 18   Wt 241 lb 2.9 oz (109.4 kg)   LMP 07/15/2024 (Approximate)   SpO2 97%   BMI 42.72 kg/m   Visual Acuity Right Eye Distance:   Left Eye Distance:   Bilateral Distance:    Right Eye Near:   Left Eye Near:  Bilateral Near:     Physical Exam Vitals and nursing note reviewed.  Constitutional:      General: She is not in acute distress.    Appearance: Normal appearance. She is not ill-appearing, toxic-appearing or diaphoretic.  Eyes:     General: No scleral icterus. Cardiovascular:     Rate and Rhythm: Normal rate and regular rhythm.     Heart sounds: Normal heart sounds.  Pulmonary:     Effort: Pulmonary effort is normal. No respiratory distress.     Breath sounds: Normal breath sounds. No wheezing or rhonchi.  Skin:    General: Skin is warm.  Neurological:     Mental Status: She is alert and oriented to person, place, and time.  Psychiatric:        Mood and Affect: Mood normal.        Behavior: Behavior normal.      UC Treatments / Results  Labs (all labs ordered are listed, but only abnormal results are displayed) Labs Reviewed  POCT URINE PREGNANCY - Abnormal; Notable for the following components:      Result Value   Preg Test, Ur Positive (*)    All other components within normal limits    EKG   Radiology No results found.  Procedures Procedures (including critical care time)  Medications Ordered in UC Medications - No data to display  Initial Impression / Assessment and Plan / UC Course  I have reviewed the triage vital signs and the nursing notes.  Pertinent labs & imaging results that were available  during my care of the patient were reviewed by me and considered in my medical decision making (see chart for details).      Final Clinical Impressions(s) / UC Diagnoses   Final diagnoses:  Amenorrhea     Discharge Instructions      You are pregnant, per urinalysis. Please follow-up with OB-GYN, keep your appt on 09/23/24   ED Prescriptions   None    PDMP not reviewed this encounter.    [1]  Social History Tobacco Use   Smoking status: Never    Passive exposure: Yes   Smokeless tobacco: Never  Vaping Use   Vaping status: Former   Substances: Nicotine, CBD, Flavoring  Substance Use Topics   Alcohol use: Not Currently    Comment: socially   Drug use: Not Currently    Types: Marijuana    Comment: daily     Andra Corean BROCKS, PA-C 09/20/24 1451  "

## 2024-09-20 NOTE — Discharge Instructions (Signed)
 You are pregnant, per urinalysis. Please follow-up with OB-GYN, keep your appt on 09/23/24

## 2024-09-20 NOTE — ED Triage Notes (Addendum)
 Pt presents requesting a verification letter for Social Services stating that she is expecting. Pt states the ED gave her the original due date.

## 2024-09-23 ENCOUNTER — Ambulatory Visit: Payer: MEDICAID | Admitting: *Deleted

## 2024-09-23 ENCOUNTER — Other Ambulatory Visit (INDEPENDENT_AMBULATORY_CARE_PROVIDER_SITE_OTHER): Payer: Self-pay

## 2024-09-23 VITALS — BP 114/80 | HR 90 | Wt 235.0 lb

## 2024-09-23 DIAGNOSIS — Z3401 Encounter for supervision of normal first pregnancy, first trimester: Secondary | ICD-10-CM | POA: Diagnosis not present

## 2024-09-23 DIAGNOSIS — Z3480 Encounter for supervision of other normal pregnancy, unspecified trimester: Secondary | ICD-10-CM | POA: Insufficient documentation

## 2024-09-23 DIAGNOSIS — O3680X0 Pregnancy with inconclusive fetal viability, not applicable or unspecified: Secondary | ICD-10-CM

## 2024-09-23 DIAGNOSIS — Z3481 Encounter for supervision of other normal pregnancy, first trimester: Secondary | ICD-10-CM | POA: Diagnosis not present

## 2024-09-23 DIAGNOSIS — Z3A09 9 weeks gestation of pregnancy: Secondary | ICD-10-CM | POA: Diagnosis not present

## 2024-09-23 DIAGNOSIS — O219 Vomiting of pregnancy, unspecified: Secondary | ICD-10-CM

## 2024-09-23 MED ORDER — BLOOD PRESSURE KIT DEVI: 1.0000 | 0 refills | Status: AC

## 2024-09-23 MED ORDER — DOXYLAMINE-PYRIDOXINE 10-10 MG PO TBEC: 2.0000 | DELAYED_RELEASE_TABLET | Freq: Every day | ORAL | 5 refills | Status: AC

## 2024-09-23 MED ORDER — PROMETHAZINE HCL 25 MG PO TABS: 25.0000 mg | ORAL_TABLET | Freq: Four times a day (QID) | ORAL | 1 refills | Status: AC | PRN

## 2024-09-23 MED ORDER — PRENATAL PLUS VITAMIN/MINERAL 27-1 MG PO TABS: 1.0000 | ORAL_TABLET | Freq: Every day | ORAL | 12 refills | Status: AC

## 2024-09-23 NOTE — Patient Instructions (Signed)
 [facility] [employer] [facility] has a partnership with the [employer] to provide prenatal navigation for the most needed resources in our community. In order to see how we can help connect you to these resources we need consent to contact you. Please complete the very short consent using the link below:   English Link: https://[place].tfaforms.net/283?site=16  Spanish Link: https://[place].tfaforms.net/287?site=16  Options for Doula Care in the [place] Area  As you review your birthing options, consider having a birth doula. A doula is trained to provide support before, during and just after you give birth. There are also postpartum doulas that help you adjust to new parenthood.  While doulas do not provide medical care, they do provide emotional, physical and educational support. A few months before your baby arrives, doulas can help answer questions, ease concerns and help you create and support your birthing plan.    Doulas can help reduce your stress and comfort you and your partner. They can help you cope with labor by helping you use breathing techniques, massage, creative labor positioning, essential oils and affirmations.   Studies show that the benefits of having a doula include:   A more positive birth experience  Fewer requests for pain-relief medication  Less likelihood of cesarean section, commonly called a c-section   Doulas are typically hired via a advertising account planner between you and the doula. We are happy to provide a list of the most active doulas in the area, all of whom are credentialed by [facility] and will not count as a visitor at your birth.  There are several options for no-cost doula care at our hospital, including:  [facility] Volunteer Doula Program [employer] Program (in [place] only) A [employer] Doula Study through [employer]  For more information on these programs or to receive a list of doulas active in our area, please  email [email or web address]

## 2024-09-23 NOTE — Progress Notes (Signed)
 New OB Intake  I connected with Emily Allen  on 09/23/24 at 10:15 AM EST by In Person Visit and verified that I am speaking with the correct person using two identifiers. Nurse is located at [facility] and pt is located at [place].  I discussed the limitations, risks, security and privacy concerns of performing an evaluation and management service by telephone and the availability of in person appointments. I also discussed with the patient that there may be a patient responsible charge related to this service. The patient expressed understanding and agreed to proceed.  I explained I am completing New OB Intake today. We discussed EDD of 04/28/25 based on US  at 9 weeks. Pt is G1P0000. I reviewed her allergies, medications and Medical/Surgical/OB history.    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 Discussed the nature of our practice with multiple providers including residents and students as well as female and female providers. Due to the size of the practice, the delivering provider may not be the same as those providing prenatal care.   Patient is not interested in water birth.  MyChart/Babyscripts MyChart access verified. I explained pt will have some visits in office and some virtually. Babyscripts instructions given and order placed. Patient verifies receipt of registration text/e-mail. Account successfully created and app downloaded. If patient is a candidate for Optimized scheduling, add to sticky note.   Blood Pressure  Cuff/Weight Scale Blood pressure cuff ordered for patient to pick-up from [employer]. Explained after first prenatal appt pt will check weekly and document in Babyscripts. Patient does not have weight scale; patient may purchase if they desire to track weight weekly in Babyscripts.  Anatomy US  Explained first scheduled US  will be around 19 weeks. Anatomy US  scheduled for TBD at [facility]BD.  Is patient a candidate for Babyscripts Optimization? No, due to Risk Factors.   First visit review I reviewed new OB appt with patient. Explained pt will be seen by Dr. Rudy at first visit. Discussed [place] genetic screening with patient. Requests Panor[employer] and Horizon.. Routine prenatal labs OB Urine collected at today's visit. Initial prenatal labs deferred to New OB appt.   Last Pap No results found for: DIAGPAP  Rocky CHRISTELLA Ober, RN 09/23/2024  12:09 PM

## 2024-10-12 ENCOUNTER — Encounter: Payer: Self-pay | Admitting: Obstetrics

## 2024-12-09 ENCOUNTER — Ambulatory Visit: Payer: MEDICAID

## 2024-12-09 ENCOUNTER — Other Ambulatory Visit: Payer: MEDICAID
